# Patient Record
Sex: Male | Born: 1960 | Race: White | Hispanic: No | Marital: Married | Smoking: Former smoker
Health system: Southern US, Community
[De-identification: ages and names within clinical notes are randomized; demographics above are authoritative.]

## PROBLEM LIST (undated history)

## (undated) DIAGNOSIS — E119 Type 2 diabetes mellitus without complications: Secondary | ICD-10-CM

## (undated) HISTORY — DX: Type 2 diabetes mellitus without complications: E11.9

## (undated) HISTORY — PX: TONSILLECTOMY: SUR1361

## (undated) HISTORY — PX: SPINE SURGERY: SHX786

---

## 2008-03-27 ENCOUNTER — Ambulatory Visit (HOSPITAL_COMMUNITY): Admission: RE | Admit: 2008-03-27 | Discharge: 2008-03-28 | Payer: Self-pay | Admitting: Orthopedic Surgery

## 2010-07-28 ENCOUNTER — Other Ambulatory Visit (INDEPENDENT_AMBULATORY_CARE_PROVIDER_SITE_OTHER): Payer: No Typology Code available for payment source | Admitting: Internal Medicine

## 2010-07-28 ENCOUNTER — Other Ambulatory Visit (INDEPENDENT_AMBULATORY_CARE_PROVIDER_SITE_OTHER): Payer: No Typology Code available for payment source

## 2010-07-28 ENCOUNTER — Encounter: Payer: Self-pay | Admitting: Internal Medicine

## 2010-07-28 ENCOUNTER — Ambulatory Visit (INDEPENDENT_AMBULATORY_CARE_PROVIDER_SITE_OTHER): Payer: No Typology Code available for payment source | Admitting: Internal Medicine

## 2010-07-28 VITALS — BP 130/80 | HR 89 | Temp 98.5°F | Resp 16 | Ht 70.0 in | Wt 213.2 lb

## 2010-07-28 DIAGNOSIS — Z0000 Encounter for general adult medical examination without abnormal findings: Secondary | ICD-10-CM

## 2010-07-28 DIAGNOSIS — Z13220 Encounter for screening for lipoid disorders: Secondary | ICD-10-CM

## 2010-07-28 LAB — CBC WITH DIFFERENTIAL/PLATELET
Basophils Absolute: 0 10*3/uL (ref 0.0–0.1)
Basophils Relative: 0.5 % (ref 0.0–3.0)
Eosinophils Relative: 4.5 % (ref 0.0–5.0)
HCT: 41.8 % (ref 39.0–52.0)
Hemoglobin: 14.7 g/dL (ref 13.0–17.0)
Lymphocytes Relative: 28.8 % (ref 12.0–46.0)
Lymphs Abs: 2.2 10*3/uL (ref 0.7–4.0)
Monocytes Relative: 13.1 % — ABNORMAL HIGH (ref 3.0–12.0)
Neutro Abs: 4.2 10*3/uL (ref 1.4–7.7)
RBC: 4.84 Mil/uL (ref 4.22–5.81)
RDW: 13.3 % (ref 11.5–14.6)

## 2010-07-28 LAB — COMPREHENSIVE METABOLIC PANEL
ALT: 47 U/L (ref 0–53)
AST: 24 U/L (ref 0–37)
Albumin: 3.8 g/dL (ref 3.5–5.2)
Alkaline Phosphatase: 72 U/L (ref 39–117)
BUN: 11 mg/dL (ref 6–23)
CO2: 28 mEq/L (ref 19–32)
Calcium: 9.2 mg/dL (ref 8.4–10.5)
Chloride: 107 mEq/L (ref 96–112)
Creatinine, Ser: 0.9 mg/dL (ref 0.4–1.5)
GFR: 91.05 mL/min (ref >60.00)
Glucose, Bld: 131 mg/dL — ABNORMAL HIGH (ref 70–99)
Potassium: 4.3 mEq/L (ref 3.5–5.1)
Sodium: 141 mEq/L (ref 135–145)
Total Bilirubin: 0.6 mg/dL (ref 0.3–1.2)
Total Protein: 6.6 g/dL (ref 6.0–8.3)

## 2010-07-28 LAB — LIPID PANEL
HDL: 39.2 mg/dL (ref >39.00)
Total CHOL/HDL Ratio: 6
VLDL: 33.4 mg/dL (ref 0.0–40.0)

## 2010-07-28 LAB — PSA: PSA: 1.38 ng/mL (ref 0.10–4.00)

## 2010-07-28 LAB — LDL CHOLESTEROL, DIRECT: Direct LDL: 165.7 mg/dL

## 2010-07-28 NOTE — Assessment & Plan Note (Signed)
Labs ordered, pt ed material given, he was reassured about the benign nature of lipomas

## 2010-07-28 NOTE — Patient Instructions (Signed)
Health Maintenance in Males MAINTAIN REGULAR HEALTH EXAMS  Maintain a healthy diet and normal weight. Increased weight leads to problems with blood pressure and diabetes. Decrease fat in the diet and increase exercise. Obtain a proper diet from your caregiver if necessary.   High blood pressure causes heart and blood vessel problems. Check blood pressures regularly and keep your blood pressure at normal limits. Aerobic exercise helps this. Persistent elevations of blood pressure should be treated with medications if weight loss and exercise are ineffective.   Avoid smoking, drinking in excess (more than 2 drinks per day), or use of street drugs. Do not share needles with anyone. Ask for help if you need assistance or instructions on stopping the use of alcohol, cigarettes, or drugs.   Maintain normal blood lipids and cholesterol. Your caregiver can give you information to lower your risk of heart disease or stroke.   Ask your caregiver if you are in need of early heart disease screening because of a strong family history of heart disease or signs of elevated testosterone (male sex hormone) levels. These can predispose you to early heart disease.   Practice safe sex. Practicing safe sex decreases your risk for a sexually transmitted infection (STI). Some of the STIs are gonorrhea, chlamydia, syphilis, trichimonas, herpes, human papillomavirus (HPV), and human immunodeficiency virus (HIV). Herpes, HIV, and HPV are viral illnesses that have no cure. These can result in disability, cancer, and death.   It is not safe for someone who has AIDS or is HIV positive to have unprotected sex with a partner who is HIV positive. The reason for this is the fact that there are many different strains of HIV. If you have a strain that is readily treated with medications and then suddenly introduce a strain from a partner that has no further treatment options, you may suddenly have a strain of HIV that is untreatable.  Even if you are both positive for HIV, it is still necessary to practice safe sex.   Use sunscreen with a SPF of 15 or greater. Being outside in the sun when your shadow caused by the sun is shorter than you are, means you are being exposed to sun at greater intensity. Lighter skinned people are at a greater risk of skin cancer.   Keep carbon monoxide and smoke detectors in your home and functioning at all times. Change the batteries every 6 months.   Do monthly examinations of your testicles. The best time to do this is after a hot shower or bath when the tissues are loose. Notify your caregivers of any lumps, tenderness, or changes in size or shape.   Notify your caregiver of new moles or changes in moles, especially if there is a change in shape or color. Also notify your caregiver if a mole is larger than the size of a pencil eraser.   Stay current with your tetanus shots and other required immunizations.  The Body Mass Index (BMI) is a way of measuring how much of your body is fat. Having a BMI above 27 increases the risk of heart disease, diabetes, hypertension, stroke, and other problems related to obesity. Document Released: 09/25/2007 Document Re-Released: 09/16/2009 [employer] Patient Information 2011 [employer]. 

## 2010-07-28 NOTE — Progress Notes (Signed)
Subjective:    Patient ID: Jonathon Cline, male    DOB: [birth year], 50 y.o.   MRN: [id number]  HPI New to me for a complete physical. He has a lipoma on his right forehead that his wife has been concerned about but he says that it is not painful and that it is getting smaller.    Review of Systems  Constitutional: Negative for fever, chills, diaphoresis, activity change, appetite change, fatigue and unexpected weight change.  HENT: Negative for facial swelling and neck pain.   Respiratory: Negative for cough, chest tightness, shortness of breath, wheezing and stridor.   Cardiovascular: Negative for chest pain, palpitations and leg swelling.  Gastrointestinal: Negative for nausea, vomiting, abdominal pain, diarrhea, constipation, blood in stool, abdominal distention and anal bleeding.  Genitourinary: Negative for dysuria, urgency, frequency, hematuria, flank pain, decreased urine volume, scrotal swelling, enuresis, difficulty urinating and testicular pain.  Musculoskeletal: Negative for myalgias, back pain, joint swelling, arthralgias and gait problem.  Skin: Negative for color change, pallor and rash.  Neurological: Negative for dizziness, tremors, seizures, syncope, facial asymmetry, speech difficulty, weakness, light-headedness, numbness and headaches.  Hematological: Negative for adenopathy. Does not bruise/bleed easily.  Psychiatric/Behavioral: Negative for hallucinations, behavioral problems, confusion, self-injury, dysphoric mood, decreased concentration and agitation. The patient is not nervous/anxious and is not hyperactive.        Objective:   Physical Exam  Vitals reviewed. Constitutional: He is oriented to person, place, and time. He appears well-developed and well-nourished. No distress.  HENT:  Head: Normocephalic and atraumatic.  Right Ear: External ear normal.  Left Ear: External ear normal.  Nose: Nose normal.  Mouth/Throat: Oropharynx is clear and moist. No  oropharyngeal exudate.  Eyes: Conjunctivae and EOM are normal. Pupils are equal, round, and reactive to light. Left eye exhibits no discharge. No scleral icterus.  Neck: Normal range of motion. Neck supple. No JVD present. No thyromegaly present.  Cardiovascular: Normal rate, regular rhythm, normal heart sounds and intact distal pulses.  Exam reveals no gallop and no friction rub.   No murmur heard. Pulmonary/Chest: Effort normal and breath sounds normal. No respiratory distress. He has no wheezes. He has no rales. He exhibits no tenderness.  Abdominal: Soft. Bowel sounds are normal. He exhibits no distension and no mass. There is no tenderness. There is no rebound and no guarding. Hernia confirmed negative in the right inguinal area and confirmed negative in the left inguinal area.  Genitourinary: Prostate normal, testes normal and penis normal. Rectal exam shows external hemorrhoid. Rectal exam shows no internal hemorrhoid, no fissure, no mass, no tenderness and anal tone normal. Guaiac negative stool. Prostate is not enlarged and not tender. Right testis shows no mass, no swelling and no tenderness. Left testis shows no mass, no swelling and no tenderness. Circumcised. No phimosis, hypospadias or penile tenderness.  Musculoskeletal: Normal range of motion. He exhibits no edema and no tenderness.  Lymphadenopathy:    He has no cervical adenopathy.       Right: No inguinal adenopathy present.       Left: No inguinal adenopathy present.  Neurological: He is alert and oriented to person, place, and time. He has normal reflexes. He displays normal reflexes. No cranial nerve deficit. He exhibits normal muscle tone. Coordination normal.  Skin: Skin is warm and dry. No rash noted. He is not diaphoretic. No erythema. No pallor.       He has a lipoma on his right forehead  Psychiatric: He has a normal mood  and affect. His behavior is normal. Judgment and thought content normal.          Assessment &  Plan:

## 2010-07-29 ENCOUNTER — Encounter: Payer: Self-pay | Admitting: Internal Medicine

## 2010-08-25 NOTE — Op Note (Signed)
NAME:  ZAE, KIRTZ NO.:  [id number]   MEDICAL RECORD NO.:  [id number]          PATIENT TYPE:  OIB   LOCATION:  5014                         FACILITY:  [facility]   PHYSICIAN:  Alvy Beal, MD    DATE OF BIRTH:  11/01/1960   DATE OF PROCEDURE:  03/27/2008  DATE OF DISCHARGE:                               OPERATIVE REPORT   HISTORY:  Jonathon Cline is a very pleasant 50 year old gentleman who is referred  to me by our partner for a prolonged period of severe right arm pain,  weakness, and neck pain.  Clinical and radiographic analysis confirmed a  large posterior lateral to the right disk herniation at C5-6 causing C6  radiculopathy.  His clinical and radiographic analysis confirmed the  diagnosis.  After discussing at length risks, benefits, and alternatives  to both surgical and nonsurgical treatment, the patient elected to  proceed with surgery.  All appropriate risks, benefits, and alternatives  were reviewed with the patient and consent was obtained.   PREOPERATIVE DIAGNOSIS:  C5-6 disk herniation posterolateral to the  right.   POSTOPERATIVE DIAGNOSIS:  C5-6 disk herniation posterolateral to the  right.   PROCEDURE:  Anterior cervical diskectomy and fusion C5-6 utilizing the  Synthes vector anterior cervical plate 16 mm high with 14-mm variable  angle locking screws and an 8-mm precut lordotic fibular bone spacer  packed with Actifuse.   OPERATIVE NOTE:  The patient was brought to the operating room and  placed supine on the operating table.  After successful induction of  general anesthesia and endotracheal intubation, TEDs and SCDs were  applied.  A roll of towels were placed between the shoulder blades and  the shoulders were retracted using tape.  The anterior cervical spine  was then prepped and draped in a standard fashion.  Intraoperative  lateral fluoroscopy indicated adequate neutral alignment.  At this  point, I made a transverse incision just below  the just at the level of  cricoid cartilage.  I then sharply dissected down to and through the  platysma.  Once the platysma was dissected, I was able to palpate and  visualize the medial border of the sternocleidomastoid.  I began  dissecting sharply into the deep cervical fascia.  The omohyoid was  identified, just released from its length and I was able to mobilize  without resecting it.  I then bluntly dissected into the deep cervical  fascia and prevertebral fascia so that I could palpate the carotid  artery and manually mobilized the trachea and esophagus to the right.  I  then placed the thyroid retractor into the wound to hold the trachea and  esophagus off to the right hand side.  At this point, I could visualize  the anterior cervical spine.  I placed an 18-gauge needle that was bent  into the interbody space of C5-6 and took a lateral x-ray to confirm  that I was at the appropriate level.  Once I had confirmed this, I then  began to mobilize the longus coli muscles and the anterior longitudinal  ligament.  Using a bipolar  electrocautery, I mobilized the longus coli  muscles out to the level of the uncovertebral joints bilaterally.  I  then placed a 45-mm shadow line Caspar retractor underneath the  mobilized longus coli muscles bilaterally and then deflated the  endotracheal cuff.  I expanded the retractor to the appropriate width  and then reinflated the endotracheal cuff __________ to get a  lower  pressure.  With the retractors placed, I had excellent visualization of  the anterior cervical spine at C5-6 and protection of the vital soft  tissue structures.   At this point, distraction pins were placed to the bodies of C5 and C6  and the disk space was gently distracted.   A 15-blade scalpel was used to incise the annulus and then using a  combination of pituitary rongeurs, curettes, and Kerrison rongeurs, I  resected all the disk material.  Once I was through the majority  of the  disk in the right posterior lateral corner, there was a large fragment  of disk material that was removed.  I then developed a plane underneath  the posterior longitudinal ligament using a fine curette and then used a  1-mm Kerrison to exploit that and resect the posterior longitudinal  ligament.  I was then able to mobilize and remove even more disk  fragment from behind the posterior longitudinal ligament.  At this  point, once I had all the disk fragment out, I was easily able to take a  micro nerve hook and pass it underneath the vertebral bodies of C5 and  C6 and underneath the uncovertebral joints  so that I would be sure  there was no further fragments of disk material.  At this point with the  diskectomy completed, I rasped the endplates so I had bleeding  subchondral bone, measured the interbody space, and then took a precut  fibular allograft strut __________ and hydrated it.  I then placed  Actifuse in the center hole and malleted to the appropriate depth.  X-  rays demonstrated satisfactory position of the graft and I took a 16-mm  plate and gave it a gentle lordotic curve and then secured it to the  vertebral body of C5 with 14-mm variable angle screws in C6 with 14 mm  variable angle screws.  All screws were locked down.  I confirmed that  the __________ all screws were locked down.  I irrigated with copiously  with normal saline, removed the rest of the retractors, and then checked  to ensure the esophagus did not become entrapped underneath the lateral  plate.  I then returned the trachea and esophagus to midline, copiously  irrigated again with normal saline, and took final intraoperative x-  rays, the AP and lateral films were satisfactory.  I then closed the  platysma with interrupted 2-0 Vicryl sutures, and the skin was closed  with 3-0 Monocryl.  Steri-Strips and a dry dressing and a soft collar  were applied.  The patient was extubated and transferred to PACU  without  incident.   At the end of the case, all needle, instrument, and sponge counts were  correct.      Alvy Beal, MD  Electronically Signed     DDB/MEDQ  D:  03/27/2008  T:  03/27/2008  Job:  [id number]

## 2011-01-15 LAB — CBC
Hemoglobin: 15.3 g/dL (ref 13.0–17.0)
MCHC: 34.3 g/dL (ref 30.0–36.0)
MCV: 85.7 fL (ref 78.0–100.0)
RBC: 5.22 MIL/uL (ref 4.22–5.81)
WBC: 10 10*3/uL (ref 4.0–10.5)

## 2012-04-25 ENCOUNTER — Ambulatory Visit (INDEPENDENT_AMBULATORY_CARE_PROVIDER_SITE_OTHER): Payer: BC Managed Care – PPO | Admitting: Family Medicine

## 2012-04-25 VITALS — BP 135/83 | HR 73 | Temp 98.4°F | Resp 16 | Ht 71.0 in | Wt 210.0 lb

## 2012-04-25 DIAGNOSIS — M545 Low back pain, unspecified: Secondary | ICD-10-CM

## 2012-04-25 MED ORDER — HYDROCODONE-ACETAMINOPHEN 5-500 MG PO TABS: 1.0000 | ORAL_TABLET | ORAL | Status: DC | PRN

## 2012-04-25 MED ORDER — CYCLOBENZAPRINE HCL 5 MG PO TABS: ORAL_TABLET | ORAL | Status: DC

## 2012-04-25 MED ORDER — OXAPROZIN 600 MG PO TABS: ORAL_TABLET | ORAL | Status: DC

## 2012-04-25 NOTE — Patient Instructions (Signed)
Read the back owners manual.  Rest  Ice/Heat  Gradually advance activity

## 2012-04-25 NOTE — Progress Notes (Signed)
Subjective: 52 year old man who has been having back pain for 5 days. No specific injury. It is just gradually progressed. There is no radiation of pain. He has had some problems with pain in the past. He gets out of bed, the pain gets so severe that about knocked to the floor. 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 He is not working hard this time. He does not do regular exercise.  Objective: No CVA tenderness. Spine is nontender. Minimal tenderness of the SI joints. On flexion is pain in the low back. Side to side tilt reveals slightly more pain when he tilts to the left. Straight leg raising test negative. Deep tendon reflexes symmetrical  Assessment: Lumbago/low back pain  Plan: Anti-inflammatory and muscle accident medication, as well as some pills for more severe pain. Back exercises manual. Return if worse.

## 2015-01-15 LAB — HM DIABETES EYE EXAM

## 2015-06-23 ENCOUNTER — Ambulatory Visit (INDEPENDENT_AMBULATORY_CARE_PROVIDER_SITE_OTHER): Payer: Commercial Managed Care - PPO

## 2015-06-23 ENCOUNTER — Ambulatory Visit (INDEPENDENT_AMBULATORY_CARE_PROVIDER_SITE_OTHER): Payer: Commercial Managed Care - PPO | Admitting: Family Medicine

## 2015-06-23 VITALS — BP 120/90 | HR 93 | Temp 97.6°F | Resp 18 | Ht 70.0 in | Wt 191.0 lb

## 2015-06-23 DIAGNOSIS — R1084 Generalized abdominal pain: Secondary | ICD-10-CM

## 2015-06-23 DIAGNOSIS — K5900 Constipation, unspecified: Secondary | ICD-10-CM | POA: Diagnosis not present

## 2015-06-23 DIAGNOSIS — E1165 Type 2 diabetes mellitus with hyperglycemia: Secondary | ICD-10-CM | POA: Diagnosis not present

## 2015-06-23 DIAGNOSIS — R81 Glycosuria: Secondary | ICD-10-CM

## 2015-06-23 LAB — POCT CBC
Granulocyte percent: 71.7 %G (ref 37–80)
HCT, POC: 46 % (ref 43.5–53.7)
HEMOGLOBIN: 16.5 g/dL (ref 14.1–18.1)
LYMPH, POC: 1.9 (ref 0.6–3.4)
MCH, POC: 30.6 pg (ref 27–31.2)
MCHC: 35.9 g/dL — AB (ref 31.8–35.4)
MCV: 85 fL (ref 80–97)
MID (CBC): 0.6 (ref 0–0.9)
MPV: 7 fL (ref 0–99.8)
PLATELET COUNT, POC: 375 10*3/uL (ref 142–424)
POC Granulocyte: 6.5 (ref 2–6.9)
POC LYMPH PERCENT: 21.2 %L (ref 10–50)
POC MID %: 7.1 % (ref 0–12)
RBC: 5.41 M/uL (ref 4.69–6.13)
RDW, POC: 12 %
WBC: 9 10*3/uL (ref 4.6–10.2)

## 2015-06-23 LAB — HEMOCCULT GUIAC POC 1CARD (OFFICE): Fecal Occult Blood, POC: NEGATIVE

## 2015-06-23 LAB — POCT URINALYSIS DIP (MANUAL ENTRY)
Bilirubin, UA: NEGATIVE
Glucose, UA: >=1000 — AB
LEUKOCYTES UA: NEGATIVE
NITRITE UA: NEGATIVE
PH UA: 5.5
Spec Grav, UA: 1.015
Urobilinogen, UA: 0.2

## 2015-06-23 LAB — POCT GLYCOSYLATED HEMOGLOBIN (HGB A1C): Hemoglobin A1C: 10.2

## 2015-06-23 LAB — GLUCOSE, POCT (MANUAL RESULT ENTRY): POC GLUCOSE: 318 mg/dL — AB (ref 70–99)

## 2015-06-23 MED ORDER — METFORMIN HCL 500 MG PO TABS: 500.0000 mg | ORAL_TABLET | Freq: Two times a day (BID) | ORAL | Status: DC

## 2015-06-23 MED ORDER — BLOOD GLUCOSE MONITOR KIT: PACK | Status: AC

## 2015-06-23 NOTE — Patient Instructions (Addendum)
IF you received an x-ray today, you will receive an invoice from [facility]. Please contact [facility] at [phone] with questions or concerns regarding your invoice.   IF you received labwork today, you will receive an invoice from [employer]. Please contact [facility] at [phone] with questions or concerns regarding your invoice.   Our billing staff will not be able to assist you with questions regarding bills from these companies.  You will be contacted with the lab results as soon as they are available. The fastest way to get your results is to activate your My Chart account. Instructions are located on the last page of this paperwork. If you have not heard from [place] regarding the results in 2 weeks, please contact this office. Diabetes and Standards of Medical Care Diabetes is complicated. You may find that your diabetes team includes a dietitian, nurse, diabetes educator, eye doctor, and more. To help everyone know what is going on and to help you get the care you deserve, the following schedule of care was developed to help keep you on track. Below are the tests, exams, vaccines, medicines, education, and plans you will need. HbA1c test This test shows how well you have controlled your glucose over the past 2-3 months. It is used to see if your diabetes management plan needs to be adjusted.   It is performed at least 2 times a year if you are meeting treatment goals.  It is performed 4 times a year if therapy has changed or if you are not meeting treatment goals. Blood pressure test  This test is performed at every routine medical visit. The goal is less than 140/90 mm Hg for most people, but 130/80 mm Hg in some cases. Ask your health care provider about your goal. Dental exam  Follow up with the dentist regularly. Eye exam  If you are diagnosed with type 1 diabetes as a child, get an exam upon reaching the age of 68 years or older and  having had diabetes for 3-5 years. Yearly eye exams are recommended after that initial eye exam.  If you are diagnosed with type 1 diabetes as an adult, get an exam within 5 years of diagnosis and then yearly.  If you are diagnosed with type 2 diabetes, get an exam as soon as possible after the diagnosis and then yearly. Foot care exam  Visual foot exams are performed at every routine medical visit. The exams check for cuts, injuries, or other problems with the feet.  You should have a complete foot exam performed every year. This exam includes an inspection of the structure and skin of your feet, a check of the pulses in your feet, and a check of the sensation in your feet.  Type 1 diabetes: The first exam is performed 5 years after diagnosis.  Type 2 diabetes: The first exam is performed at the time of diagnosis.  Check your feet nightly for cuts, injuries, or other problems with your feet. Tell your health care provider if anything is not healing. Kidney function test (urine microalbumin)  This test is performed once a year.  Type 1 diabetes: The first test is performed 5 years after diagnosis.  Type 2 diabetes: The first test is performed at the time of diagnosis.  A serum creatinine and estimated glomerular filtration rate (eGFR) test is done once a year to assess the level of chronic kidney disease (CKD), if present. Lipid profile (cholesterol, HDL, LDL, triglycerides)  Performed every 5  years for most people.  The goal for LDL is less than 100 mg/dL. If you are at high risk, the goal is less than 70 mg/dL.  The goal for HDL is 40 mg/dL-50 mg/dL for men and 50 mg/dL-60 mg/dL for women. An HDL cholesterol of 60 mg/dL or higher gives some protection against heart disease.  The goal for triglycerides is less than 150 mg/dL. Immunizations  The flu (influenza) vaccine is recommended yearly for every person 78 months of age or older who has diabetes.  The pneumonia (pneumococcal)  vaccine is recommended for every person 46 years of age or older who has diabetes. Adults 45 years of age or older may receive the pneumonia vaccine as a series of two separate shots.  The hepatitis B vaccine is recommended for adults shortly after they have been diagnosed with diabetes.  The Tdap (tetanus, diphtheria, and pertussis) vaccine should be given:  According to normal childhood vaccination schedules, for children.  Every 10 years, for adults who have diabetes. Diabetes self-management education  Education is recommended at diagnosis and ongoing as needed. Treatment plan  Your treatment plan is reviewed at every medical visit.   This information is not intended to replace advice given to you by your health care provider. Make sure you discuss any questions you have with your health care provider.   Document Released: 01/24/2009 Document Revised: 04/19/2014 Document Reviewed: 08/29/2012 Elsevier Interactive Patient Education 2016 [employer].   Diabetes Mellitus and Food It is important for you to manage your blood sugar (glucose) level. Your blood glucose level can be greatly affected by what you eat. Eating healthier foods in the appropriate amounts throughout the day at about the same time each day will help you control your blood glucose level. It can also help slow or prevent worsening of your diabetes mellitus. Healthy eating may even help you improve the level of your blood pressure and reach or maintain a healthy weight.  General recommendations for healthful eating and cooking habits include:  Eating meals and snacks regularly. Avoid going long periods of time without eating to lose weight.  Eating a diet that consists mainly of plant-based foods, such as fruits, vegetables, nuts, legumes, and whole grains.  Using low-heat cooking methods, such as baking, instead of high-heat cooking methods, such as deep frying. Work with your dietitian to make sure you understand  how to use the Nutrition Facts information on food labels. HOW CAN FOOD AFFECT ME? Carbohydrates Carbohydrates affect your blood glucose level more than any other type of food. Your dietitian will help you determine how many carbohydrates to eat at each meal and teach you how to count carbohydrates. Counting carbohydrates is important to keep your blood glucose at a healthy level, especially if you are using insulin or taking certain medicines for diabetes mellitus. Alcohol Alcohol can cause sudden decreases in blood glucose (hypoglycemia), especially if you use insulin or take certain medicines for diabetes mellitus. Hypoglycemia can be a life-threatening condition. Symptoms of hypoglycemia (sleepiness, dizziness, and disorientation) are similar to symptoms of having too much alcohol.  If your health care provider has given you approval to drink alcohol, do so in moderation and use the following guidelines:  Women should not have more than one drink per day, and men should not have more than two drinks per day. One drink is equal to:  12 oz of beer.  5 oz of wine.  1 oz of hard liquor.  Do not drink on an empty  stomach.  Keep yourself hydrated. Have water, diet soda, or unsweetened iced tea.  Regular soda, juice, and other mixers might contain a lot of carbohydrates and should be counted. WHAT FOODS ARE NOT RECOMMENDED? As you make food choices, it is important to remember that all foods are not the same. Some foods have fewer nutrients per serving than other foods, even though they might have the same number of calories or carbohydrates. It is difficult to get your body what it needs when you eat foods with fewer nutrients. Examples of foods that you should avoid that are high in calories and carbohydrates but low in nutrients include:  Trans fats (most processed foods list trans fats on the Nutrition Facts label).  Regular soda.  Juice.  Candy.  Sweets, such as cake, pie,  doughnuts, and cookies.  Fried foods. WHAT FOODS CAN I EAT? Eat nutrient-rich foods, which will nourish your body and keep you healthy. The food you should eat also will depend on several factors, including:  The calories you need.  The medicines you take.  Your weight.  Your blood glucose level.  Your blood pressure level.  Your cholesterol level. You should eat a variety of foods, including:  Protein.  Lean cuts of meat.  Proteins low in saturated fats, such as fish, egg whites, and beans. Avoid processed meats.  Fruits and vegetables.  Fruits and vegetables that may help control blood glucose levels, such as apples, mangoes, and yams.  Dairy products.  Choose fat-free or low-fat dairy products, such as milk, yogurt, and cheese.  Grains, bread, pasta, and rice.  Choose whole grain products, such as multigrain bread, whole oats, and brown rice. These foods may help control blood pressure.  Fats.  Foods containing healthful fats, such as nuts, avocado, olive oil, canola oil, and fish. DOES EVERYONE WITH DIABETES MELLITUS HAVE THE SAME MEAL PLAN? Because every person with diabetes mellitus is different, there is not one meal plan that works for everyone. It is very important that you meet with a dietitian who will help you create a meal plan that is just right for you.   This information is not intended to replace advice given to you by your health care provider. Make sure you discuss any questions you have with your health care provider.   Document Released: 12/24/2004 Document Revised: 04/19/2014 Document Reviewed: 02/23/2013 Elsevier Interactive Patient Education [employer].

## 2015-06-23 NOTE — Progress Notes (Signed)
Subjective:  By signing my name below, I, [name], attest that this documentation has been prepared under the direction and in the presence of Delman Cheadle, MD.  Electronically Signed: Thea Alken, ED Scribe. 06/23/2015. 5:33 PM.   Patient ID: Jonathon Cline, male    DOB: Oct 10, 1960, 55 y.o.   MRN: [id number]  HPI Chief Complaint  Patient presents with  . Possible Hernia    Pain for three weeks    HPI Comments: Jonathon Cline is a 55 y.o. male who presents to the [facility] complaining of a abdominal pain that began a couple weeks ago. He reports associated bulge right abdomen, testicular pain and rectal pain with BM. He denies nausea, emesis, constipation, color change in testicle and urinary symptoms.   Patient Active Problem List   Diagnosis Date Noted  . Routine general medical examination at a health care facility 07/28/2010   No past medical history on file. Past Surgical History  Procedure Laterality Date  . Spine surgery      cervical fusion   No Known Allergies Prior to Admission medications   Medication Sig Start Date End Date Taking? Authorizing Provider  cyclobenzaprine (FLEXERIL) 5 MG tablet One or two tablets 3 times daily for muscle relaxant Patient not taking: Reported on 06/23/2015 04/25/12   Posey Boyer, MD  HYDROcodone-acetaminophen (VICODIN) 5-500 MG per tablet Take 1 tablet by mouth every 4 (four) hours as needed for pain. Patient not taking: Reported on 06/23/2015 04/25/12   Posey Boyer, MD  oxaprozin (DAYPRO) 600 MG tablet Take one twice daily for pain and inflammation of back Patient not taking: Reported on 06/23/2015 04/25/12   Posey Boyer, MD   Social History   Social History  . Marital Status: Married    Spouse Name: N/A  . Number of Children: N/A  . Years of Education: N/A   Occupational History  . Not on file.   Social History Main Topics  . Smoking status: Former Research scientist (life sciences)  . Smokeless tobacco: Never Used  . Alcohol  Use: No  . Drug Use: No  . Sexual Activity: Not on file   Other Topics Concern  . Not on file   Social History Narrative   Married    Highest level of education- College   Caffienated beverages-Yes   Seats belts often-Yes   Regular exercise-Yes   Firearms in the home-Yes   Physical abuse-NO   Smoke alarm in home-Yes            Review of Systems  Constitutional: Negative for fever and chills.  Gastrointestinal: Positive for abdominal pain and rectal pain. Negative for nausea, vomiting, diarrhea and constipation.  Genitourinary: Positive for testicular pain. Negative for dysuria, urgency, frequency, decreased urine volume and difficulty urinating.  Skin: Negative for color change.    Objective:   Physical Exam  Constitutional: He is oriented to person, place, and time. He appears well-developed and well-nourished. No distress.  HENT:  Head: Normocephalic and atraumatic.  Eyes: Conjunctivae and EOM are normal.  Neck: Neck supple.  Cardiovascular: Normal rate, regular rhythm and normal heart sounds.   No murmur heard. Pulmonary/Chest: Effort normal and breath sounds normal. No respiratory distress. He has no wheezes. He has no rales. He exhibits no tenderness.  Abdominal: Bowel sounds are decreased. There is generalized tenderness. No hernia. Hernia confirmed negative in the right inguinal area and confirmed negative in the left inguinal area.  Genitourinary: Prostate normal. Rectal exam  shows external hemorrhoid ( 2+) and internal hemorrhoid ( large at 6 o'clock). Rectal exam shows anal tone normal. Prostate is not enlarged.  Diastasis recti. No umbilical hernia. No inguinal hernia. No stool in vault.   Musculoskeletal: Normal range of motion.  Neurological: He is alert and oriented to person, place, and time.  Skin: Skin is warm and dry.  Psychiatric: He has a normal mood and affect. His behavior is normal.  Nursing note and vitals reviewed.  Filed Vitals:   06/23/15 1649    BP: 120/90  Pulse: 93  Temp: 97.6 F (36.4 C)  TempSrc: Oral  Resp: 18  Height: '5\' 10"'$  (1.778 m)  Weight: 191 lb (86.637 kg)  SpO2: 98%   Results for orders placed or performed in visit on 06/23/15  POCT occult blood stool  Result Value Ref Range   Fecal Occult Blood, POC Negative Negative   Card #1 Date 06/23/15    Card #2 Fecal Occult Blod, POC     Card #2 Date     Card #3 Fecal Occult Blood, POC     Card #3 Date    POCT urinalysis dipstick  Result Value Ref Range   Color, UA yellow yellow   Clarity, UA clear clear   Glucose, UA >=1,000 (A) negative   Bilirubin, UA negative negative   Ketones, POC UA trace (5) (A) negative   Spec Grav, UA 1.015    Blood, UA small (A) negative   pH, UA 5.5    Protein Ur, POC =30 (A) negative   Urobilinogen, UA 0.2    Nitrite, UA Negative Negative   Leukocytes, UA Negative Negative  POCT glycosylated hemoglobin (Hb A1C)  Result Value Ref Range   Hemoglobin A1C 10.2   POCT glucose (manual entry)  Result Value Ref Range   POC Glucose 318 (A) 70 - 99 mg/dl  POCT CBC  Result Value Ref Range   WBC 9.0 4.6 - 10.2 K/uL   Lymph, poc 1.9 0.6 - 3.4   POC LYMPH PERCENT 21.2 10 - 50 %L   MID (cbc) 0.6 0 - 0.9   POC MID % 7.1 0 - 12 %M   POC Granulocyte 6.5 2 - 6.9   Granulocyte percent 71.7 37 - 80 %G   RBC 5.41 4.69 - 6.13 M/uL   Hemoglobin 16.5 14.1 - 18.1 g/dL   HCT, POC 46.0 43.5 - 53.7 %   MCV 85.0 80 - 97 fL   MCH, POC 30.6 27 - 31.2 pg   MCHC 35.9 (A) 31.8 - 35.4 g/dL   RDW, POC 12.0 %   Platelet Count, POC 375 142 - 424 K/uL   MPV 7.0 0 - 99.8 fL   Dg Abd 2 Views  06/23/2015  CLINICAL DATA:  Abdominal pain, lump in RIGHT abdomen, testicular pain with bowel movement, question hernia, worsened diffuse abdominal pain since heavy lifting 3 weeks ago EXAM: ABDOMEN - 2 VIEW COMPARISON:  None FINDINGS: Normal bowel gas pattern. No bowel dilatation or bowel wall thickening. Osseous structures unremarkable. No urinary tract  calcification. No extension of bowel gas into the RIGHT inguinal region is evident. IMPRESSION: Normal exam. Electronically Signed   By: Lavonia Dana M.D.   On: 06/23/2015 18:08    Assessment & Plan:   1. Generalized abdominal pain   2. Glucosuria   3. Constipation, unspecified constipation type   4. Type 2 diabetes mellitus with hyperglycemia, without long-term current use of insulin ([place]) - new diagnosis today -  pt and wife shocked.  Reviewed how to check cbgs and interpret #s, diet, and natural history/progression. Cons checking c-peptide level since pt is not the typical type 2 picture.  He will make a f/u appt with his PCP asap - has not been seen in sev yrs.  Start metformin - I suspect his abd pain maybe due to constipation or poss may even has some gastroparesis contributing?  Reassess after starting metformin which should help trx the constipation.    Orders Placed This Encounter  Procedures  . DG Abd 2 Views    Standing Status: Future     Number of Occurrences: 1     Standing Expiration Date: 06/22/2016    Order Specific Question:  Reason for Exam (SYMPTOM  OR DIAGNOSIS REQUIRED)    Answer:  diffuse abdominal pain worsening since heavy lifting 3 wks prior    Order Specific Question:  Preferred imaging location?    Answer:  External  . Comprehensive metabolic panel  . Ambulatory referral to diabetic education    Referral Priority:  Routine    Referral Type:  Consultation    Referral Reason:  Specialty Services Required    Number of Visits Requested:  1  . POCT occult blood stool  . POCT urinalysis dipstick  . POCT glycosylated hemoglobin (Hb A1C)  . POCT glucose (manual entry)  . POCT CBC    Meds ordered this encounter  Medications  . metFORMIN (GLUCOPHAGE) 500 MG tablet    Sig: Take 1 tablet (500 mg total) by mouth 2 (two) times daily with a meal.    Dispense:  60 tablet    Refill:  0  . blood glucose meter kit and supplies KIT    Sig: Dispense based on patient and  insurance preference. Use up to four times daily as directed. E11.65    Dispense:  1 each    Refill:  0    Order Specific Question:  Number of strips    Answer:  1000    Order Specific Question:  Number of lancets    Answer:  1000   Over 40 min spent in face-to-face evaluation of and consultation with patient and coordination of care.  Over 50% of this time was spent counseling this patient.   I personally performed the services described in this documentation, which was scribed in my presence. The recorded information has been reviewed and considered, and addended by me as needed.  Delman Cheadle, MD MPH

## 2015-06-24 LAB — COMPREHENSIVE METABOLIC PANEL
ALT: 68 U/L — ABNORMAL HIGH (ref 9–46)
AST: 45 U/L — AB (ref 10–35)
Albumin: 4.4 g/dL (ref 3.6–5.1)
Alkaline Phosphatase: 129 U/L — ABNORMAL HIGH (ref 40–115)
BILIRUBIN TOTAL: 0.8 mg/dL (ref 0.2–1.2)
BUN: 15 mg/dL (ref 7–25)
CALCIUM: 9.8 mg/dL (ref 8.6–10.3)
CO2: 32 mmol/L — AB (ref 20–31)
CREATININE: 0.84 mg/dL (ref 0.70–1.33)
Chloride: 97 mmol/L — ABNORMAL LOW (ref 98–110)
GLUCOSE: 326 mg/dL — AB (ref 65–99)
Potassium: 4.1 mmol/L (ref 3.5–5.3)
SODIUM: 137 mmol/L (ref 135–146)
Total Protein: 7.5 g/dL (ref 6.1–8.1)

## 2015-06-30 ENCOUNTER — Telehealth: Payer: Self-pay | Admitting: Internal Medicine

## 2015-06-30 NOTE — Telephone Encounter (Signed)
yes

## 2015-06-30 NOTE — Telephone Encounter (Signed)
Scheduled appt.

## 2015-06-30 NOTE — Telephone Encounter (Signed)
Is requesting to re establish with Dr. Ronnald Ramp.  Please advise.

## 2015-07-01 ENCOUNTER — Encounter: Payer: Self-pay | Admitting: Family Medicine

## 2015-07-04 ENCOUNTER — Other Ambulatory Visit (INDEPENDENT_AMBULATORY_CARE_PROVIDER_SITE_OTHER): Payer: Commercial Managed Care - PPO

## 2015-07-04 ENCOUNTER — Ambulatory Visit (HOSPITAL_COMMUNITY)
Admission: RE | Admit: 2015-07-04 | Discharge: 2015-07-04 | Disposition: A | Discharge status: Home / Self Care | Payer: Commercial Managed Care - PPO | Source: Ambulatory Visit | Attending: Gastroenterology | Admitting: Gastroenterology

## 2015-07-04 ENCOUNTER — Ambulatory Visit (INDEPENDENT_AMBULATORY_CARE_PROVIDER_SITE_OTHER): Payer: Commercial Managed Care - PPO | Admitting: Gastroenterology

## 2015-07-04 ENCOUNTER — Encounter: Payer: Self-pay | Admitting: Gastroenterology

## 2015-07-04 VITALS — BP 120/80 | HR 95 | Ht 70.0 in | Wt 187.0 lb

## 2015-07-04 DIAGNOSIS — R945 Abnormal results of liver function studies: Secondary | ICD-10-CM

## 2015-07-04 DIAGNOSIS — R109 Unspecified abdominal pain: Secondary | ICD-10-CM | POA: Insufficient documentation

## 2015-07-04 DIAGNOSIS — R7989 Other specified abnormal findings of blood chemistry: Secondary | ICD-10-CM

## 2015-07-04 DIAGNOSIS — R634 Abnormal weight loss: Secondary | ICD-10-CM | POA: Insufficient documentation

## 2015-07-04 LAB — COMPREHENSIVE METABOLIC PANEL
ALT: 36 U/L (ref 0–53)
AST: 15 U/L (ref 0–37)
Albumin: 4.2 g/dL (ref 3.5–5.2)
Alkaline Phosphatase: 97 U/L (ref 39–117)
BUN: 11 mg/dL (ref 6–23)
CO2: 32 meq/L (ref 19–32)
Calcium: 10 mg/dL (ref 8.4–10.5)
Chloride: 98 mEq/L (ref 96–112)
Creatinine, Ser: 1.02 mg/dL (ref 0.40–1.50)
GFR: 80.32 mL/min (ref >60.00)
GLUCOSE: 169 mg/dL — AB (ref 70–99)
POTASSIUM: 4.4 meq/L (ref 3.5–5.1)
SODIUM: 138 meq/L (ref 135–145)
Total Bilirubin: 0.8 mg/dL (ref 0.2–1.2)
Total Protein: 7.5 g/dL (ref 6.0–8.3)

## 2015-07-04 LAB — CBC WITH DIFFERENTIAL/PLATELET
BASOS ABS: 0.1 10*3/uL (ref 0.0–0.1)
Basophils Relative: 0.7 % (ref 0.0–3.0)
Eosinophils Absolute: 0.3 10*3/uL (ref 0.0–0.7)
Eosinophils Relative: 2.6 % (ref 0.0–5.0)
HCT: 45 % (ref 39.0–52.0)
Hemoglobin: 15.4 g/dL (ref 13.0–17.0)
LYMPHS ABS: 2.3 10*3/uL (ref 0.7–4.0)
Lymphocytes Relative: 23.2 % (ref 12.0–46.0)
MCHC: 34.4 g/dL (ref 30.0–36.0)
MCV: 84.8 fl (ref 78.0–100.0)
MONO ABS: 1.4 10*3/uL — AB (ref 0.1–1.0)
MONOS PCT: 14.3 % — AB (ref 3.0–12.0)
NEUTROS ABS: 5.9 10*3/uL (ref 1.4–7.7)
NEUTROS PCT: 59.2 % (ref 43.0–77.0)
PLATELETS: 389 10*3/uL (ref 150.0–400.0)
RBC: 5.3 Mil/uL (ref 4.22–5.81)
RDW: 12.3 % (ref 11.5–15.5)
WBC: 10 10*3/uL (ref 4.0–10.5)

## 2015-07-04 LAB — PROTIME-INR
INR: 1.2 ratio — ABNORMAL HIGH (ref 0.8–1.0)
Prothrombin Time: 12.8 s (ref 9.6–13.1)

## 2015-07-04 MED ORDER — IOHEXOL 300 MG/ML  SOLN
50.0000 mL | Freq: Once | INTRAMUSCULAR | Status: AC | PRN
  Administered 2015-07-04: 50 mL via ORAL

## 2015-07-04 MED ORDER — HYDROCODONE-ACETAMINOPHEN 5-325 MG PO TABS: 1.0000 | ORAL_TABLET | Freq: Four times a day (QID) | ORAL | Status: DC | PRN

## 2015-07-04 MED ORDER — IOPAMIDOL (ISOVUE-300) INJECTION 61%
100.0000 mL | Freq: Once | INTRAVENOUS | Status: AC | PRN
  Administered 2015-07-04: 100 mL via INTRAVENOUS

## 2015-07-04 NOTE — Progress Notes (Signed)
HPI: This is a very pleasant 55 year old man     who was referred to me by Jonathon Lima, MD  to evaluate  abdominal pollen, change in bowel habits, elevated liver tests .    Chief complaint is abdominal pains, change in bowel habits, elevated liver tests  Change in his bowels, has been quite constipated (mag citrate 2 bottles, dulcolax).  Lost control of a hand truck, injured his abd wall.  Took some vicodin after that.  Sees Dr. Ronnald Cline as new patient, to address the DM.  Has been losing weight recently in psat 1-2 months.  He's lost 15-20 pounds in the past couple months  Has abd pains after eating, soon afterwards.   Pain is pan abdominal, feels like he is blocked, the pain seems really fairly constant but it is worse after he eats. He is having trouble sleeping because the pain  Used to have BM daily. Lately No BM without a laxative.  No overt blood in his stool.  Etoh weekly, not really an issue for him.  His father had lung cancer   Plain abd film 06/2015: normal Labs 06/2015: cbc normal, Hb A1c 10.2, liver tests (alk phos, transaminases slightly elevated)   Review of systems: Pertinent positive and negative review of systems were noted in the above HPI section. Complete review of systems was performed and was otherwise normal.   Past Medical History  Diagnosis Date  . Diabetes [facility])     Past Surgical History  Procedure Laterality Date  . Spine surgery      cervical fusion    Current Outpatient Prescriptions  Medication Sig Dispense Refill  . bisacodyl (DULCOLAX) 5 MG EC tablet Take 5 mg by mouth daily as needed for moderate constipation.    . blood glucose meter kit and supplies KIT Dispense based on patient and insurance preference. Use up to four times daily as directed. E11.65 1 each 0  . Docusate Sodium (DULCOLAX STOOL SOFTENER PO) Take by mouth as needed.    . metFORMIN (GLUCOPHAGE) 500 MG tablet Take 1 tablet (500 mg total) by mouth 2 (two) times daily  with a meal. 60 tablet 0   No current facility-administered medications for this visit.    Allergies as of 07/04/2015  . (No Known Allergies)    Family History  Problem Relation Age of Onset  . Cancer Other     Lung  . Lung cancer Mother   . Lung cancer Father     Social History   Social History  . Marital Status: Married    Spouse Name: N/A  . Number of Children: N/A  . Years of Education: N/A   Occupational History  . Not on file.   Social History Main Topics  . Smoking status: Former Research scientist (life sciences)  . Smokeless tobacco: Never Used  . Alcohol Use: No  . Drug Use: No  . Sexual Activity: Not on file   Other Topics Concern  . Not on file   Social History Narrative   Married    Highest level of education- College   Caffienated beverages-Yes   Seats belts often-Yes   Regular exercise-Yes   Firearms in the home-Yes   Physical abuse-NO   Smoke alarm in home-Yes              Physical Exam: Ht '5\' 10"'  (1.778 m)  Wt 187 lb (84.823 kg)  BMI 26.83 kg/m2 Constitutional: generally well-appearing Psychiatric: alert and oriented x3 Eyes: extraocular movements intact Mouth: oral  pharynx moist, no lesions Neck: supple no lymphadenopathy Cardiovascular: heart regular rate and rhythm Lungs: clear to auscultation bilaterally Abdomen: soft, Mild abdominal tenderness throughout nondistended, no obvious ascites, no peritoneal signs, normal bowel sounds Extremities: no lower extremity edema bilaterally Skin: no lesions on visible extremities   Assessment and plan: 56 y.o. male with  abdominal pain, elevated liver tests, newly diabetic, unintentional weight loss  Fairly concerned about the possibility of underlying malignancy here. Metastatic pancreas cancer, metastatic colon cancer? Start this workup with repeat set of labs including a CBC, complete metabolic profile, coags. We are going to arrange for an abdominal CT scan with IV and oral contrast hopefully to be done  today.   Jonathon Loffler, MD [facility] 07/04/2015, 11:13 AM  Cc: Jonathon Lima, MD

## 2015-07-04 NOTE — Patient Instructions (Addendum)
You will be set up for a CT scan of abdomen and pelvis with IV and oral contrast (for abd pains, elevated liver tests). You will have labs checked today in the basement lab.  Please head down after you check out with the front desk  (cmet, cbc, inr). Trial of miralax  (one dose once daily) for now.  You have been scheduled for a CT scan of the abdomen and pelvis at [place] over to [facility] now for your CT scan  ________________________________________________________________________

## 2015-07-07 ENCOUNTER — Telehealth: Payer: Self-pay | Admitting: *Deleted

## 2015-07-07 ENCOUNTER — Telehealth: Payer: Self-pay | Admitting: Gastroenterology

## 2015-07-07 ENCOUNTER — Other Ambulatory Visit (INDEPENDENT_AMBULATORY_CARE_PROVIDER_SITE_OTHER): Payer: Commercial Managed Care - PPO

## 2015-07-07 ENCOUNTER — Other Ambulatory Visit: Payer: Self-pay

## 2015-07-07 DIAGNOSIS — C762 Malignant neoplasm of abdomen: Secondary | ICD-10-CM | POA: Diagnosis not present

## 2015-07-07 LAB — PSA: PSA: 0.87 ng/mL (ref 0.10–4.00)

## 2015-07-07 MED ORDER — NA SULFATE-K SULFATE-MG SULF 17.5-3.13-1.6 GM/177ML PO SOLN: 1.0000 | Freq: Once | ORAL | Status: DC

## 2015-07-07 NOTE — Telephone Encounter (Signed)
Oncology Nurse Navigator Documentation  Oncology Nurse Navigator Flowsheets 07/07/2015  Navigator Location [facility]  Navigator Encounter Type Introductory phone call  Spoke with patient and provided new patient appointment for 07/10/15 at 2:30 pm with Dr. Truitt Merle. Informed of location of [place], valet service, and registration process. Reminded to bring insurance cards and a current medication list, including supplements. Patient verbalizes understanding. HIM notified via inbasket.

## 2015-07-07 NOTE — Telephone Encounter (Signed)
The pt did confirm that he does not need to drink the contrast for the Chest CT

## 2015-07-08 LAB — CEA: CEA: 147.3 ng/mL — ABNORMAL HIGH

## 2015-07-08 LAB — CANCER ANTIGEN 19-9: CA 19 9: 271 U/mL — AB (ref <34)

## 2015-07-09 ENCOUNTER — Encounter: Payer: Self-pay | Admitting: Gastroenterology

## 2015-07-09 ENCOUNTER — Ambulatory Visit (AMBULATORY_SURGERY_CENTER): Payer: Commercial Managed Care - PPO | Admitting: Gastroenterology

## 2015-07-09 ENCOUNTER — Telehealth: Payer: Self-pay

## 2015-07-09 VITALS — BP 132/89 | HR 95 | Temp 98.9°F | Resp 11 | Ht 70.0 in | Wt 187.0 lb

## 2015-07-09 DIAGNOSIS — D123 Benign neoplasm of transverse colon: Secondary | ICD-10-CM

## 2015-07-09 DIAGNOSIS — K209 Esophagitis, unspecified: Secondary | ICD-10-CM | POA: Diagnosis not present

## 2015-07-09 DIAGNOSIS — C7951 Secondary malignant neoplasm of bone: Secondary | ICD-10-CM

## 2015-07-09 DIAGNOSIS — R1084 Generalized abdominal pain: Secondary | ICD-10-CM | POA: Diagnosis not present

## 2015-07-09 DIAGNOSIS — K219 Gastro-esophageal reflux disease without esophagitis: Secondary | ICD-10-CM | POA: Diagnosis not present

## 2015-07-09 LAB — HM COLONOSCOPY

## 2015-07-09 LAB — GLUCOSE, CAPILLARY
GLUCOSE-CAPILLARY: 152 mg/dL — AB (ref 65–99)
Glucose-Capillary: 111 mg/dL — ABNORMAL HIGH (ref 65–99)

## 2015-07-09 MED ORDER — SODIUM CHLORIDE 0.9 % IV SOLN: 500.0000 mL | INTRAVENOUS | Status: DC

## 2015-07-09 NOTE — Telephone Encounter (Signed)
  Oncology Nurse Navigator Documentation  Navigator Location: [facility] (07/09/15 1128) Navigator Encounter Type: Telephone (07/09/15 1128) Telephone: Incoming Call;Appt Confirmation/Clarification (07/09/15 1128)  Wife called to clarify his appointments-reviewed CT appt., Dr. Burr Medico visit and biospy for 07/15/15. She is asking if biopsy could be done sooner? Called interventional radiology and spoke with Tiffany: 07/15/15 is first available they have. Will follow up on referral to Dr. Lisbeth Renshaw.

## 2015-07-09 NOTE — Telephone Encounter (Signed)
The referral has been made.  I will confirm appt with Dr Lisbeth Renshaw

## 2015-07-09 NOTE — Progress Notes (Signed)
A/ox3, pleased with MAC, report to RN 

## 2015-07-09 NOTE — Progress Notes (Signed)
Called to room to assist during endoscopic procedure.  Patient ID and intended procedure confirmed with present staff. Received instructions for my participation in the procedure from the performing physician.  

## 2015-07-09 NOTE — Patient Instructions (Signed)
Colon polyp x 1 removed, and biopsies taken. Result letter in your mail in about 2-3 weeks.  Handout given on polyps.   YOU HAD AN ENDOSCOPIC PROCEDURE TODAY AT [facility]:   Refer to the procedure report that was given to you for any specific questions about what was found during the examination.  If the procedure report does not answer your questions, please call your gastroenterologist to clarify.  If you requested that your care partner not be given the details of your procedure findings, then the procedure report has been included in a sealed envelope for you to review at your convenience later.  YOU SHOULD EXPECT: Some feelings of bloating in the abdomen. Passage of more gas than usual.  Walking can help get rid of the air that was put into your GI tract during the procedure and reduce the bloating. If you had a lower endoscopy (such as a colonoscopy or flexible sigmoidoscopy) you may notice spotting of blood in your stool or on the toilet paper. If you underwent a bowel prep for your procedure, you may not have a normal bowel movement for a few days.  Please Note:  You might notice some irritation and congestion in your nose or some drainage.  This is from the oxygen used during your procedure.  There is no need for concern and it should clear up in a day or so.  SYMPTOMS TO REPORT IMMEDIATELY:   Following lower endoscopy (colonoscopy or flexible sigmoidoscopy):  Excessive amounts of blood in the stool  Significant tenderness or worsening of abdominal pains  Swelling of the abdomen that is new, acute  Fever of 100F or higher   Following upper endoscopy (EGD)  Vomiting of blood or coffee ground material  New chest pain or pain under the shoulder blades  Painful or persistently difficult swallowing  New shortness of breath  Fever of 100F or higher  Black, tarry-looking stools  For urgent or emergent issues, a gastroenterologist can be reached at any hour by calling  [phone].   DIET: Your first meal following the procedure should be a small meal and then it is ok to progress to your normal diet. Heavy or fried foods are harder to digest and may make you feel nauseous or bloated.  Likewise, meals heavy in dairy and vegetables can increase bloating.  Drink plenty of fluids but you should avoid alcoholic beverages for 24 hours.  ACTIVITY:  You should plan to take it easy for the rest of today and you should NOT DRIVE or use heavy machinery until tomorrow (because of the sedation medicines used during the test).    FOLLOW UP: Our staff will call the number listed on your records the next business day following your procedure to check on you and address any questions or concerns that you may have regarding the information given to you following your procedure. If we do not reach you, we will leave a message.  However, if you are feeling well and you are not experiencing any problems, there is no need to return our call.  We will assume that you have returned to your regular daily activities without incident.  If any biopsies were taken you will be contacted by phone or by letter within the next 1-3 weeks.  Please call us at [phone] if you have not heard about the biopsies in 3 weeks.    SIGNATURES/CONFIDENTIALITY: You and/or your care partner have signed paperwork which will be entered into  your electronic medical record.  These signatures attest to the fact that that the information above on your After Visit Summary has been reviewed and is understood.  Full responsibility of the confidentiality of this discharge information lies with you and/or your care-partner.

## 2015-07-09 NOTE — Telephone Encounter (Signed)
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  [place], He has no back pain, no tenderness along spine either but I'm hoping you can see him in clinic.  I think he may need your help in the future sometime. Thanks

## 2015-07-09 NOTE — Op Note (Signed)
[facility] Patient Name: Jonathon Cline Procedure Date: 07/09/2015 8:37 AM MRN: [id number] Endoscopist: Milus Banister , MD Age: 55 Referring MD:  Date of Birth: 1961/02/16 Gender: Male Procedure:                Upper GI endoscopy Indications:              Generalized abdominal pain Medicines:                Monitored Anesthesia Care Procedure:                Pre-Anesthesia Assessment:                           - Prior to the procedure, a History and Physical                            was performed, and patient medications and                            allergies were reviewed. The patient's tolerance of                            previous anesthesia was also reviewed. The risks                            and benefits of the procedure and the sedation                            options and risks were discussed with the patient.                            All questions were answered, and informed consent                            was obtained. Prior Anticoagulants: The patient has                            taken no previous anticoagulant or antiplatelet                            agents. ASA Grade Assessment: II - A patient with                            mild systemic disease. After reviewing the risks                            and benefits, the patient was deemed in                            satisfactory condition to undergo the procedure.                           After obtaining informed consent, the endoscope was  passed under direct vision. Throughout the                            procedure, the patient's blood pressure, pulse, and                            oxygen saturations were monitored continuously. The                            Model GIF-[id number]) scope was introduced                            through the mouth, and advanced to the third part                            of duodenum. The upper GI endoscopy was                  accomplished without difficulty. The patient                            tolerated the procedure well. Scope In: Scope Out: Findings:      LA Grade A (one or more mucosal breaks less than 5 mm, not extending       between tops of 2 mucosal folds) esophagitis with no bleeding was found       at the gastroesophageal junction. Biopsies were taken with a cold       forceps for histology.      A small hiatal hernia was present.      The exam was otherwise without abnormality. Complications:            No immediate complications. Estimated blood loss:                            None. Estimated Blood Loss:     Estimated blood loss: none. Impression:               - LA Grade A esophagitis. Biopsied.                           - Small hiatal hernia.                           - The examination was otherwise normal. Recommendation:           - Patient has a contact number available for                            emergencies. The signs and symptoms of potential                            delayed complications were discussed with the                            patient. Return to normal activities tomorrow.  Written discharge instructions were provided to the                            patient.                           - Resume previous diet.                           - Continue present medications.                           - Await pathology results. Procedure Code(s):        --- Professional ---                           [phone], Esophagogastroduodenoscopy, flexible,                            transoral; with biopsy, single or multiple CPT copyright 2016 American Medical Association. All rights reserved. Milus Banister, MD 07/09/2015 9:12:26 AM This report has been signed electronically. Number of Addenda: 0 Referring MD:      Janith Lima, MD

## 2015-07-09 NOTE — Telephone Encounter (Signed)
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  [place], He has no back pain, no tenderness along spine either but I'm hoping you can see him in clinic.  I think he may need your help in the future sometime. Thanks

## 2015-07-09 NOTE — Op Note (Signed)
[facility] Patient Name: Toran Wimpy Procedure Date: 07/09/2015 8:38 AM MRN: [id number] Endoscopist: Milus Banister , MD Age: 55 Referring MD:  Date of Birth: 28-Dec-1960 Gender: Male Procedure:                Colonoscopy Indications:              Generalized abdominal pain (recent CT showed                            peritoneal carcinomatosis, unknown primary) Medicines:                Monitored Anesthesia Care Procedure:                Pre-Anesthesia Assessment:                           - Prior to the procedure, a History and Physical                            was performed, and patient medications and                            allergies were reviewed. The patient's tolerance of                            previous anesthesia was also reviewed. The risks                            and benefits of the procedure and the sedation                            options and risks were discussed with the patient.                            All questions were answered, and informed consent                            was obtained. Prior Anticoagulants: The patient has                            taken no previous anticoagulant or antiplatelet                            agents. ASA Grade Assessment: II - A patient with                            mild systemic disease. After reviewing the risks                            and benefits, the patient was deemed in                            satisfactory condition to undergo the procedure.  After obtaining informed consent, the colonoscope                            was passed under direct vision. Throughout the                            procedure, the patient's blood pressure, pulse, and                            oxygen saturations were monitored continuously. The                            Model CF-HQ190L [id number]) scope was introduced                            through the anus and advanced to the the  cecum,                            identified by appendiceal orifice and ileocecal                            valve. The colonoscopy was performed without                            difficulty. The patient tolerated the procedure                            well. The quality of the bowel preparation was                            excellent. The ileocecal valve, appendiceal                            orifice, and rectum were photographed. Scope In: 8:48:49 AM Scope Out: 8:57:45 AM Scope Withdrawal Time: 0 hours 5 minutes 46 seconds  Total Procedure Duration: 0 hours 8 minutes 56 seconds  Findings:      A 9 mm polyp was found in the transverse colon. The polyp was sessile.       The polyp was removed with a cold snare. Resection and retrieval were       complete.      The exam was otherwise without abnormality on direct and retroflexion       views. Complications:            No immediate complications. Estimated blood loss:                            None. Estimated Blood Loss:     Estimated blood loss: none. Impression:               - One 9 mm polyp in the transverse colon, removed                            with a cold snare. Resected and retrieved.                           -  The examination was otherwise normal on direct                            and retroflexion views. Recommendation:           - Patient has a contact number available for                            emergencies. The signs and symptoms of potential                            delayed complications were discussed with the                            patient. Return to normal activities tomorrow.                            Written discharge instructions were provided to the                            patient.                           - Resume previous diet.                           - Continue present medications.                           - Repeat colonoscopy is recommended. The                            colonoscopy  date will be determined after pathology                            results from today's exam become available for                            review. Procedure Code(s):        --- Professional ---                           [phone], Colonoscopy, flexible; with removal of                            tumor(s), polyp(s), or other lesion(s) by snare                            technique CPT copyright 2016 [employer]. All rights reserved. Milus Banister, MD 07/09/2015 9:00:12 AM This report has been signed electronically. Number of Addenda: 0 Referring MD:      Janith Lima, MD

## 2015-07-10 ENCOUNTER — Encounter (HOSPITAL_COMMUNITY): Payer: Self-pay

## 2015-07-10 ENCOUNTER — Telehealth: Payer: Self-pay | Admitting: Hematology

## 2015-07-10 ENCOUNTER — Encounter: Payer: Self-pay | Admitting: Hematology

## 2015-07-10 ENCOUNTER — Telehealth: Payer: Self-pay | Admitting: Gastroenterology

## 2015-07-10 ENCOUNTER — Ambulatory Visit (HOSPITAL_COMMUNITY)
Admission: RE | Admit: 2015-07-10 | Discharge: 2015-07-10 | Disposition: A | Discharge status: Home / Self Care | Payer: Commercial Managed Care - PPO | Source: Ambulatory Visit | Attending: Gastroenterology | Admitting: Gastroenterology

## 2015-07-10 ENCOUNTER — Ambulatory Visit (HOSPITAL_BASED_OUTPATIENT_CLINIC_OR_DEPARTMENT_OTHER): Payer: Commercial Managed Care - PPO | Admitting: Hematology

## 2015-07-10 ENCOUNTER — Telehealth: Payer: Self-pay | Admitting: Emergency Medicine

## 2015-07-10 VITALS — BP 125/82 | HR 99 | Temp 98.5°F | Resp 18 | Ht 70.0 in | Wt 187.1 lb

## 2015-07-10 DIAGNOSIS — C762 Malignant neoplasm of abdomen: Secondary | ICD-10-CM | POA: Diagnosis not present

## 2015-07-10 DIAGNOSIS — R188 Other ascites: Secondary | ICD-10-CM | POA: Diagnosis not present

## 2015-07-10 DIAGNOSIS — C786 Secondary malignant neoplasm of retroperitoneum and peritoneum: Secondary | ICD-10-CM

## 2015-07-10 DIAGNOSIS — R1084 Generalized abdominal pain: Secondary | ICD-10-CM

## 2015-07-10 DIAGNOSIS — C7951 Secondary malignant neoplasm of bone: Secondary | ICD-10-CM | POA: Diagnosis not present

## 2015-07-10 DIAGNOSIS — R918 Other nonspecific abnormal finding of lung field: Secondary | ICD-10-CM | POA: Insufficient documentation

## 2015-07-10 DIAGNOSIS — R634 Abnormal weight loss: Secondary | ICD-10-CM

## 2015-07-10 DIAGNOSIS — K8020 Calculus of gallbladder without cholecystitis without obstruction: Secondary | ICD-10-CM | POA: Diagnosis not present

## 2015-07-10 DIAGNOSIS — R1030 Lower abdominal pain, unspecified: Secondary | ICD-10-CM

## 2015-07-10 DIAGNOSIS — C801 Malignant (primary) neoplasm, unspecified: Secondary | ICD-10-CM

## 2015-07-10 DIAGNOSIS — C799 Secondary malignant neoplasm of unspecified site: Secondary | ICD-10-CM

## 2015-07-10 DIAGNOSIS — R630 Anorexia: Secondary | ICD-10-CM

## 2015-07-10 DIAGNOSIS — K760 Fatty (change of) liver, not elsewhere classified: Secondary | ICD-10-CM | POA: Insufficient documentation

## 2015-07-10 MED ORDER — HYDROCODONE-ACETAMINOPHEN 5-325 MG PO TABS: 1.0000 | ORAL_TABLET | Freq: Four times a day (QID) | ORAL | Status: DC | PRN

## 2015-07-10 MED ORDER — IOPAMIDOL (ISOVUE-300) INJECTION 61%
75.0000 mL | Freq: Once | INTRAVENOUS | Status: AC | PRN
  Administered 2015-07-10: 75 mL via INTRAVENOUS

## 2015-07-10 MED ORDER — HYDROCODONE BITARTRATE 10 MG PO C12A: 10.0000 mg | EXTENDED_RELEASE_CAPSULE | Freq: Two times a day (BID) | ORAL | Status: DC

## 2015-07-10 MED ORDER — FENTANYL 25 MCG/HR TD PT72: 25.0000 ug | MEDICATED_PATCH | TRANSDERMAL | Status: DC

## 2015-07-10 NOTE — Telephone Encounter (Signed)
Gave and printed appt sched adn avs for pt for April

## 2015-07-10 NOTE — Telephone Encounter (Signed)
  Follow up Call-  Call back number 07/09/2015  Post procedure Call Back phone  # [phone]  Permission to leave phone message Yes     Patient questions:  Do you have a fever, pain , or abdominal swelling? No. Pain Score  0 *  Have you tolerated food without any problems? Yes.    Have you been able to return to your normal activities? Yes.    Do you have any questions about your discharge instructions: Diet   No. Medications  No. Follow up visit  No.  Do you have questions or concerns about your Care? No.  Actions: * If pain score is 4 or above: No action needed, pain <4.

## 2015-07-10 NOTE — Telephone Encounter (Signed)
I will walk over and get it signed.

## 2015-07-10 NOTE — Progress Notes (Signed)
[facility]  Telephone:([phone]) [phone] Fax:([phone]) [place] Note   Patient Care Team: Janith Lima, MD as PCP - General (Internal Medicine) 07/10/2015   Referring physician: Dr. Ardis Hughs  CHIEF COMPLAINTS/PURPOSE OF CONSULTATION:  Abnormal scan findings, suspicious for metastatic malignancy.  HISTORY OF PRESENTING ILLNESS:  Jonathon Cline 55 y.o. male is here because of his abnormal CT scan findings, which is harvest spacers for metastatic malignancy. He is accompanied by his wife and sister to my clinic today.  He had an accident when he was moving a heavy stuff one month ago and he though he pulled his abdominal muscles. He has been having intermittent lower abdominal pain since then, it locates in his low abd , worse when he laugh, cough or after meals, it wakes him up at night sometimes. He also noticed mild chest pain when he coughs or lumps. No dyspnea or productive cough. He denies significant abdomen blating, no nausea or change of BM (except constipation from pain meds), he has lost aboiot 10-15 lbs in the past one month, appetite lower, stopped working one months due to the pain, he takes 2-6 vicodin a day. He was initially seen by urgent care, lab showed hyperglycemia, abnormal liver function. He was referred to gastroenterologist Dr. Ardis Hughs. CT of abdomen and pelvis with contrast was obtained on 07/04/2015, which showed peritoneal carcinomatosis, lytic bone lesions in T9 and T11, no definitive primary malignancy. He underwent EGD and colonoscopy yesterday by Dr. Ardis Hughs, no primary tumor was found.  He has not see her PCP for 5 years, and has scheduled to see PCP next week.   MEDICAL HISTORY:  Past Medical History  Diagnosis Date  . Diabetes [facility])     SURGICAL HISTORY: Past Surgical History  Procedure Laterality Date  . Spine surgery      cervical fusion  . Tonsillectomy      SOCIAL HISTORY: Social History   Social History  . Marital  Status: Married    Spouse Name: N/A  . Number of Children: N/A  . Years of Education: N/A   Occupational History  . Not on file.   Social History Main Topics  . Smoking status: Former Research scientist (life sciences)  . Smokeless tobacco: Never Used  . Alcohol Use: No  . Drug Use: No  . Sexual Activity: Not on file   Other Topics Concern  . Not on file   Social History Narrative   Married    Highest level of education- College   Caffienated beverages-Yes   Seats belts often-Yes   Regular exercise-Yes   Firearms in the home-Yes   Physical abuse-NO   Smoke alarm in home-Yes             FAMILY HISTORY: Family History  Problem Relation Age of Onset  . Cancer Other     Lung  . Lung cancer Mother   . Lung cancer Father   . Colon cancer Neg Hx   . Esophageal cancer Neg Hx   . Stomach cancer Neg Hx     ALLERGIES:  has No Known Allergies.  MEDICATIONS:  Current Outpatient Prescriptions  Medication Sig Dispense Refill  . bisacodyl (DULCOLAX) 5 MG EC tablet Take 5 mg by mouth daily as needed for moderate constipation. Reported on 07/09/2015    . blood glucose meter kit and supplies KIT Dispense based on patient and insurance preference. Use up to four times daily as directed. E11.65 1 each 0  . Docusate Sodium (DULCOLAX  STOOL SOFTENER PO) Take by mouth as needed.    Marland Kitchen HYDROcodone-acetaminophen (NORCO/VICODIN) 5-325 MG tablet Take 1 tablet by mouth every 6 (six) hours as needed for moderate pain. 50 tablet 0  . metFORMIN (GLUCOPHAGE) 500 MG tablet Take 1 tablet (500 mg total) by mouth 2 (two) times daily with a meal. 60 tablet 0   No current facility-administered medications for this visit.    REVIEW OF SYSTEMS:   Constitutional: Denies fevers, chills or abnormal night sweats Eyes: Denies blurriness of vision, double vision or watery eyes Ears, nose, mouth, throat, and face: Denies mucositis or sore throat Respiratory: Denies cough, dyspnea or wheezes Cardiovascular: Denies palpitation, chest  discomfort or lower extremity swelling Gastrointestinal:  Denies nausea, heartburn or change in bowel habits Skin: Denies abnormal skin rashes Lymphatics: Denies new lymphadenopathy or easy bruising Neurological:Denies numbness, tingling or new weaknesses Behavioral/Psych: Mood is stable, no new changes  All other systems were reviewed with the patient and are negative.  PHYSICAL EXAMINATION: ECOG PERFORMANCE STATUS: 1 - Symptomatic but completely ambulatory  Filed Vitals:   07/10/15 1514  BP: 125/82  Pulse: 99  Temp: 98.5 F (36.9 C)  Resp: 18   Filed Weights   07/10/15 1514  Weight: 187 lb 1.6 oz (84.868 kg)    GENERAL:alert, no distress and comfortable SKIN: skin color, texture, turgor are normal, no rashes or significant lesions EYES: normal, conjunctiva are pink and non-injected, sclera clear OROPHARYNX:no exudate, no erythema and lips, buccal mucosa, and tongue normal  NECK: supple, thyroid normal size, non-tender, without nodularity LYMPH:  no palpable lymphadenopathy in the cervical, axillary or inguinal LUNGS: clear to auscultation and percussion with normal breathing effort HEART: regular rate & rhythm and no murmurs and no lower extremity edema ABDOMEN:abdomen soft, Slightly bloated, diffuse tenderness in his lower abdomen, no organomegaly , no palpable mass, normal bowel sounds Musculoskeletal:no cyanosis of digits and no clubbing  PSYCH: alert & oriented x 3 with fluent speech NEURO: no focal motor/sensory deficits  LABORATORY DATA:  I have reviewed the data as listed CBC Latest Ref Rng 07/04/2015 06/23/2015 07/28/2010  WBC 4.0 - 10.5 K/uL 10.0 9.0 7.8  Hemoglobin 13.0 - 17.0 g/dL 15.4 16.5 14.7  Hematocrit 39.0 - 52.0 % 45.0 46.0 41.8  Platelets 150.0 - 400.0 K/uL 389.0 - 298.0    CMP Latest Ref Rng 07/04/2015 06/23/2015 07/28/2010  Glucose 70 - 99 mg/dL 169(H) 326(H) 131(H)  BUN 6 - 23 mg/dL _0 Creatinine 0.40 - 1.50 mg/dL 1.02 0.84 0.9  Sodium 135 -  145 mEq/L 138 137 141  Potassium 3.5 - 5.1 mEq/L 4.4 4.1 4.3  Chloride 96 - 112 mEq/L 98 97(L) 107  CO2 19 - 32 mEq/L 32 32(H) 28  Calcium 8.4 - 10.5 mg/dL 10.0 9.8 9.2  Total Protein 6.0 - 8.3 g/dL 7.5 7.5 6.6  Total Bilirubin 0.2 - 1.2 mg/dL 0.8 0.8 0.6  Alkaline Phos 39 - 117 U/L 97 129(H) 72  AST 0 - 37 U/L 15 45(H) 24  ALT 0 - 53 U/L 36 68(H) 47   Results for Jonathon, Cline (MRN [id number]) as of 07/10/2015 08:31  Ref. Range 07/07/2015 08:51  CA 19-9 Latest Ref Range: <34 U/mL 271 (H)  CEA Latest Units: ng/mL 147.3 (H)  PSA Latest Ref Range: 0.10-4.00 ng/mL 0.87    RADIOGRAPHIC STUDIES: I have personally reviewed the radiological images as listed and agreed with the findings in the report.  Ct Abdomen Pelvis W Contrast 07/04/2015  IMPRESSION:  1. Findings of peritoneal carcinomatosis, including omental caking, mesenteric nodularity, nodular peritoneal thickening in the bilateral paracolic gutters, thickening and enhancement of the pelvic peritoneum and trace pelvic ascites.  2. Lytic osseous lesions in the T9 and T11 vertebral bodies worrisome for osseous metastases. The T11 osseous lesion appears to minimally efface the thoracic spinal canal anteriorly at this level, without CT evidence of thoracic cord compression. Correlate with thoracic MRI as clinically warranted.  3. A definite primary malignancy is not evident. Nonspecific mild circumferential wall thickening in the lower thoracic esophagus, and questionable wall thickening in the gastric antrum, cannot exclude neoplasm in these locations. Consider further evaluation with upper and lower endoscopy and PET-CT.  4. Two 3 mm pulmonary nodules in the right lower lobe, indeterminate.  5. Additional findings include coronary atherosclerosis, diffuse hepatic steatosis, cholelithiasis and mild prostatomegaly. These results will be called to the ordering clinician or representative by the Radiologist Assistant, and communication  documented in the PACS or zVision Dashboard. Electronically Signed   By: Ilona Sorrel M.D.   On: 07/04/2015 17:11   CT chest w contrast 07/10/2015 IMPRESSION: 9 mm spiculated pulmonary nodule in left upper lobe, with morphology suspicious for a primary bronchogenic carcinoma. 6 mm mediastinal lymph node in the lateral aortic region noted, but not considered pathologically enlarged. Consider PET-CT for further evaluation.  Indeterminate sub-cm right lung nodules, largest measuring 5 mm. Continued attention recommended on follow-up imaging.  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  Findings of upper abdominal peritoneal carcinomatosis again noted.  EGD AND COLONOSCOPY 07/09/2015 Impression: A Grade A esophagitis. Biopsied. - Small hiatal hernia. - The examination was otherwise normal. - One 9 mm polyp in the transverse colon, removed with a cold snare. Resected and retrieved. - The examination was otherwise normal on direct and retroflexion views.   ASSESSMENT & PLAN:  55 year old Caucasian male, without significant past medical history, former smoker with 20 pack years smoking history, presented with abdominal pain, anorexia, weight loss.   1. 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 -His CT scan findings are highly suspicious for metastatic malignancy. There is no convincing primary tumor. His EGD and colonoscopy yesterday and was negative for primary tumor. -The 9 mm left upper lung nodule is very suspicious for a primary lung cancer, but not very typical as the primary tumor which causes the diffuse metastatic disease due to its small size. It also could be a metastatic lesion.  -His tumor marker showed significant elevated CEA and CA 19.9, which supports for a upper GI primary. -He is scheduled to have a PET scan  tomorrow, to further evaluate the extensiveness of metastatic disease -He is scheduled to have a CT-guided omental biopsy next week. Hopefully the immunostains will guild [place] to find the primary tumor -Further genomic testing will be based on the type of malignancy -Unfortunately, his malignancy is likely incurable, the prognosis and treatment plan will be determined by the type of malignancy.  2. Abdominal pain -He is taking Vicodin 2-5 tablets a day, the pain wakes him up at night -I suggest him to take extensor release hydrocodone, starting from low dose at night, and titrate as needed.  3. Anorexia and weight loss -I encouraged him to try nutritional supplement, such as Ensure or Boost  -NUTRITION consult  Recommendations: Based on information available as of  today's consult. Recommendations may change depending on the results of further tests or exams. 1) Proceed with diagnostic workup with biopsy and PET scan 2) Start long acting pain medication (ZOHYDRO) 20m at night, with short acting pain medication as needed. Hold on fentanyl which was prescribed by Dr. JArdis Hughs 3) Be sure to keep your bowels moving: Miralax daily and Senna-S (up to 4/day)  Next Steps: 1) PET scan 3/31 and Dr. MLisbeth Renshawon 07/14/15 2) Biopsy on 07/15/15 3) Return to discuss biopsy result on 4/6 or 4/7 4) Call for increase in pain or problem with bowel or bladder control/leg weakness   All questions were answered. The patient knows to call the clinic with any problems, questions or concerns. I spent 55 minutes counseling the patient face to face. The total time spent in the appointment was 60 minutes and more than 50% was on counseling.     FTruitt Merle MD 07/10/2015 8:25 AM

## 2015-07-10 NOTE — Telephone Encounter (Signed)
Can you come over and sign the rx today so the pt can pick it up?  If not, I will walk over.

## 2015-07-10 NOTE — Telephone Encounter (Signed)
Dr Ardis Hughs please advise the pt would like to have something for pain sent in to [facility] for abd and chest pain,  The pain is at least a 3/10 at all times but can increase to a 10/10.  He has not tried any medications for the pain.

## 2015-07-10 NOTE — Telephone Encounter (Signed)
i can't come over.  Maybe someone there can sign it?  If not, I'm in [facility] endo. thanks  He has newly diagnosed peritoneal carcinomatosis causing pain.

## 2015-07-10 NOTE — Telephone Encounter (Signed)
Ok, lets start him on fentanyl patch 65mcg per hour dose.  Use each patch for 3 days, disp one month, cannot put refills now but I'm happy to prescibe more if/when he needs more.

## 2015-07-11 ENCOUNTER — Encounter: Payer: Self-pay | Admitting: Hematology

## 2015-07-11 ENCOUNTER — Encounter (HOSPITAL_COMMUNITY)
Admission: RE | Admit: 2015-07-11 | Discharge: 2015-07-11 | Disposition: A | Discharge status: Home / Self Care | Payer: Commercial Managed Care - PPO | Source: Ambulatory Visit | Attending: Gastroenterology | Admitting: Gastroenterology

## 2015-07-11 DIAGNOSIS — C7951 Secondary malignant neoplasm of bone: Secondary | ICD-10-CM | POA: Insufficient documentation

## 2015-07-11 DIAGNOSIS — C786 Secondary malignant neoplasm of retroperitoneum and peritoneum: Secondary | ICD-10-CM | POA: Diagnosis not present

## 2015-07-11 DIAGNOSIS — R911 Solitary pulmonary nodule: Secondary | ICD-10-CM | POA: Insufficient documentation

## 2015-07-11 DIAGNOSIS — C762 Malignant neoplasm of abdomen: Secondary | ICD-10-CM | POA: Diagnosis present

## 2015-07-11 DIAGNOSIS — R180 Malignant ascites: Secondary | ICD-10-CM | POA: Diagnosis not present

## 2015-07-11 LAB — GLUCOSE, CAPILLARY: GLUCOSE-CAPILLARY: 134 mg/dL — AB (ref 65–99)

## 2015-07-11 MED ORDER — FLUDEOXYGLUCOSE F - 18 (FDG) INJECTION
9.2700 | Freq: Once | INTRAVENOUS | Status: AC | PRN
  Administered 2015-07-11: 9.27 via INTRAVENOUS

## 2015-07-11 NOTE — Progress Notes (Addendum)
Histology and Location of Primary Cancer: , Unknown Primary  Biopsy Esophagus 07/09/15 pending   Sites of Visceral and Bony Metastatic Disease: spinal mets referral from Dr. Rudene Anda  Location(s) of Symptomatic Metastases:  T9, T11   Past/Anticipated chemotherapy by medical oncology, if any: Dr. Burr Medico 07/10/15, Biopsy abdomen  scheduled 07/15/15  Return to discuss on 4/6 or 4/7 17   Pain on a scale of 0-10 is:  4/10 lower back, abdomen, and right shoulder, has a torn ligament right shoulder stated,    If Spine Met(s), symptoms, if any, include:  Bowel/Bladder retention or incontinence (please describe):constipated takes miralax, senna  Numbness or weakness in extremities (please describe no  Current Decadron regimen, if applicable: no  Ambulatory status? Walker? Wheelchair?:ambulatory  SAFETY ISSUES: no  Prior radiation? NO  Pacemaker/ICD? NO  Is the patient on methotrexate? 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  EXAM: NUCLEAR MEDICINE PET SKULL BASE TO THIGH  TECHNIQUE: 9.27 mCi F-18 FDG was injected intravenously. Full-ring PET imaging was performed from the skull base to thigh after the radiotracer. CT data was obtained and used for attenuation correction and anatomic localization.  FASTING BLOOD GLUCOSE: Value: 134 mg/dl  COMPARISON: None  FINDINGS: NECK  No hypermetabolic lymph nodes in the neck.  CHEST  No hypermetabolic mediastinal or hilar nodes. Left upper lobe pulmonary nodule measures 9 mm and has an SUV max equal to 3.4. There is a subpleural nodule within the posterior right lower lobe which measures 7 mm, image 40 of series 6. This is too small to reliably characterize by PET-CT. No significant FDG uptake is noted on the corresponding PET images however.  ABDOMEN/PELVIS  No abnormal hypermetabolic activity within the liver,  pancreas, adrenal glands, or spleen. 15 mm gallstone is identified. No hypermetabolic lymph nodes in the abdomen or pelvis. Moderate ascites is identified within the pelvis. There is increased activity within the ascites which suggest malignant ascites. There is evidence of extensive peritoneal carcinomatosis with omental caking. Right omental cake, image 144 of series 4, has an SUV max equal to 6.24.  SKELETON  Multifocal areas of hypermetabolic lytic bone metastases are identified. Index lesion within the right inferior pubic ramus has an SUV max equal to 15.29. Hypermetabolic lytic lesion involving the right femoral head has an SUV max equal to 9.64. Multiple hypermetabolic lytic are identified within the cervical, thoracic and lumbar spine. The most concerning lesion is at the T11 level. This has an SUV max equal to 13.59. This is situated within the posterior vertebral body and there is destruction along the posterior cortex. Canal involvement cannot be excluded. Expansile and destructive left second rib lesion is identified. This has an SUV max equal to 14.8. Hypermetabolic lesion involving the proximal right femur has an SUV max equal to 6.7.  IMPRESSION: 1. Examination is positive for multifocal hypermetabolic lytic bone metastases. Lesions are identified within the extremities, spine and pelvis. Most concerning is a lytic lesion involving the posterior aspect of the T11 vertebra. If there is a concern for canal involvement then an MRI with contrast material may be helpful for further assessment. 2. Hypermetabolic spiculated nodule within the left upper lobe is identified. This may represent a primary pulmonary malignancy or a focus of metastatic disease. 3. Evidence of peritoneal carcinomatosis with presumed malignant ascites and omental caking.     Married, Former smoker cigarettes,off and on for years, 1 ppd  Then 46  years quit 2007,  no alcohol or drug use,no  smokeless tobacco,  Mother Lung cancer,,Father lung cancer, both smokers, paternal grandmother uterine cancer to lung, non smoker, paternal uncles unknown cancers BP 124/82 mmHg  Pulse 113  Temp(Src) 97.7 F (36.5 C) (Oral)  Resp 20  Ht _0  (1.778 m)  Wt 186 lb 11.2 oz (84.687 kg)  BMI 26.79 kg/m2  SpO2 100%  Wt Readings from Last 3 Encounters:  07/14/15 186 lb 11.2 oz (84.687 kg)  07/10/15 187 lb 1.6 oz (84.868 kg)  07/09/15 187 lb (84.823 kg)  . Pain in lower back and abonmen,   Allergies: NKA Colonoscopy 07/09/15 Diagnosis 07/09/15: 1. Surgical [P], transverse, polyp - TUBULAR ADENOMA. - NO HIGH GRADE DYSPLASIA OR MALIGNANCY. 2. Surgical [P], GE junction - FINDINGS CONSISTENT WITH REFLUX. - NO INTESTINAL METAPLASIA, DYSPLASIA, OR MALIGNANCY.

## 2015-07-14 ENCOUNTER — Ambulatory Visit
Admission: RE | Admit: 2015-07-14 | Discharge: 2015-07-14 | Disposition: A | Discharge status: Home / Self Care | Payer: Commercial Managed Care - PPO | Source: Ambulatory Visit | Attending: Radiation Oncology | Admitting: Radiation Oncology

## 2015-07-14 ENCOUNTER — Encounter: Payer: Self-pay | Admitting: Radiation Oncology

## 2015-07-14 ENCOUNTER — Other Ambulatory Visit: Payer: Self-pay | Admitting: General Surgery

## 2015-07-14 VITALS — BP 124/82 | HR 113 | Temp 97.7°F | Resp 20 | Ht 70.0 in | Wt 186.7 lb

## 2015-07-14 DIAGNOSIS — C7951 Secondary malignant neoplasm of bone: Secondary | ICD-10-CM | POA: Diagnosis present

## 2015-07-14 DIAGNOSIS — C801 Malignant (primary) neoplasm, unspecified: Secondary | ICD-10-CM | POA: Insufficient documentation

## 2015-07-14 DIAGNOSIS — Z87891 Personal history of nicotine dependence: Secondary | ICD-10-CM | POA: Insufficient documentation

## 2015-07-14 DIAGNOSIS — Z801 Family history of malignant neoplasm of trachea, bronchus and lung: Secondary | ICD-10-CM | POA: Diagnosis not present

## 2015-07-14 DIAGNOSIS — C786 Secondary malignant neoplasm of retroperitoneum and peritoneum: Secondary | ICD-10-CM | POA: Insufficient documentation

## 2015-07-14 DIAGNOSIS — E119 Type 2 diabetes mellitus without complications: Secondary | ICD-10-CM | POA: Insufficient documentation

## 2015-07-14 DIAGNOSIS — Z510 Encounter for antineoplastic radiation therapy: Secondary | ICD-10-CM | POA: Insufficient documentation

## 2015-07-14 NOTE — Progress Notes (Signed)
Radiation Oncology         [phone] ________________________________  Name: Jonathon Cline MRN: [id number]  Date: 07/14/2015  DOB: 10-25-1960  CC:Scarlette Calico, MD  Milus Banister, MD     REFERRING PHYSICIAN: Milus Banister, MD   DIAGNOSIS: The primary encounter diagnosis was Bone metastasis [facility]). A diagnosis of Osseous metastasis ([place]) was also pertinent to this visit.  Peritoneal Carcinomatosis with Unknown Primary  HISTORY OF PRESENT ILLNESS::Jonathon Cline is a 55 y.o. male who is seen for an initial consultation visit regarding the patient's diagnosis of peritoneal carcinomatosis with unknown primary.  The patient presented to Dr. Ardis Hughs of Gastroenterology, on 07/04/15, with abdominal pain, constipation, weight loss, and elevated liver tests. CT of the abdomen and pelvis on 07/04/15 revealed two 3 mm pulmonary nodules in the posterior right lower lobe, finding of peritoneal carcinomatosis, a 1.8 x 1.1 cm osseous lesion in the T11 vertebral body, a 1.2 x 0.9 osseous lesion in the T9 vertebral body, and wall thickening in the gastric antrum and lower thoracic esophagus. A colonoscopy on 07/09/15 revealed a 9 mm polyp in the transverse colon. The polyp was resected and revealed tubular adenoma with no high grade dysplasia or malignancy. An upper GI endoscopy the same day noted LA Grade A esophagitis with no bleeding at the GE junction. Biopsies were consistent with reflux and no intestinal metaplasia, dysplasia, or malignancy. CT of the chest w/ contrast on 07/10/15 showed a 6 mm mediastinal lymph node in the lateral aortic region, a 9 mm pulmonary nodule in the left upper lobe, a 5 mm right upper lobe nodule, multiple sub-centimeter pulmonary nodules in the right lower lobe, a lytic bone lesion in the left lateral second rib, multiple small sclerotic bone lesions involving the cervical and thoracic vertebral bodies, and upper abdominal peritoneal carcinomatosis. His tumor marker showed  significantly elevated CEA and CA 19.9, which supports for a upper GI primary. The patient saw Dr. Burr Medico on 07/10/15 who recommended the patient to see radiation oncology for local treatment options. PET scan on 07/11/15 was positive for multifocal hypermetabolic bone metastases, a hypermetabolic left upper lobe pulmonary nodule, and evidence of peritoneal carcinomatosis with presumed malignant ascites and omental caking.   PREVIOUS RADIATION THERAPY: No   PAST MEDICAL HISTORY:  has a past medical history of Diabetes ([place]).     PAST SURGICAL HISTORY: Past Surgical History  Procedure Laterality Date  . Spine surgery      cervical fusion  . Tonsillectomy       FAMILY HISTORY: family history includes Cancer in his other; Cancer (age of onset: 3) in his paternal grandmother; Lung cancer in his father and mother. There is no history of Colon cancer, Esophageal cancer, or Stomach cancer.   SOCIAL HISTORY:  reports that he quit smoking about 11 years ago. He has never used smokeless tobacco. He reports that he does not drink alcohol or use illicit drugs.   ALLERGIES: Review of patient's allergies indicates no known allergies.   MEDICATIONS:  Current Outpatient Prescriptions  Medication Sig Dispense Refill  . bisacodyl (DULCOLAX) 5 MG EC tablet Take 5 mg by mouth daily as needed for moderate constipation. Reported on 07/10/2015    . blood glucose meter kit and supplies KIT Dispense based on patient and insurance preference. Use up to four times daily as directed. E11.65 1 each 0  . Docusate Sodium (DULCOLAX STOOL SOFTENER PO) Take by mouth as needed. Reported on 07/10/2015    .  HYDROcodone-acetaminophen (NORCO/VICODIN) 5-325 MG tablet Take 1 tablet by mouth every 6 (six) hours as needed for moderate pain. 60 tablet 0  . metFORMIN (GLUCOPHAGE) 500 MG tablet Take 1 tablet (500 mg total) by mouth 2 (two) times daily with a meal. 60 tablet 0  . fentaNYL (DURAGESIC - DOSED MCG/HR) 25 MCG/HR patch  Place 1 patch (25 mcg total) onto the skin every 3 (three) days. (Patient not taking: Reported on 07/10/2015) 5 patch 0  . HYDROcodone Bitartrate (ZOHYDRO ER) 10 MG C12A Take 10 mg by mouth every 12 (twelve) hours. 20 each 0   No current facility-administered medications for this encounter.     REVIEW OF SYSTEMS:  A 15 point review of systems is documented in the electronic medical record. This was obtained by the nursing staff. However, I reviewed this with the patient to discuss relevant findings and make appropriate changes.  Pertinent items are noted in HPI.  The patient has 4/10 pain in his lower back and abdomen. He also has right shoulder pain and he explains that this is due to a torn ligament. The patient reports constipation and is taking miralax and senna for this. He describes no pain in his legs. Denies nausea or emesis. He describes a new onset of blurry vision  PHYSICAL EXAM:  height is _0  (1.778 m) and weight is 186 lb 11.2 oz (84.687 kg). His oral temperature is 97.7 F (36.5 C). His blood pressure is 124/82 and his pulse is 113. His respiration is 20 and oxygen saturation is 100%.    Lungs are clear to auscultation bilaterally. Heart has regular rate and rhythm. Hypoactive bowel sounds  ECOG = 1  0 - Asymptomatic (Fully active, able to carry on all predisease activities without restriction)  1 - Symptomatic but completely ambulatory (Restricted in physically strenuous activity but ambulatory and able to carry out work of a light or sedentary nature. For example, light housework, office work)  2 - Symptomatic, <50% in bed during the day (Ambulatory and capable of all self care but unable to carry out any work activities. Up and about more than 50% of waking hours)  3 - Symptomatic, >50% in bed, but not bedbound (Capable of only limited self-care, confined to bed or chair 50% or more of waking hours)  4 - Bedbound (Completely disabled. Cannot carry on any self-care.  Totally confined to bed or chair)  5 - Death   Eustace Pen MM, Creech RH, Tormey DC, et al. [phone]). "Toxicity and response criteria of the [facility]". [place] Oncol. 5 (6): 649-55    LABORATORY DATA:  Lab Results  Component Value Date   WBC 10.0 07/04/2015   HGB 15.4 07/04/2015   HCT 45.0 07/04/2015   MCV 84.8 07/04/2015   PLT 389.0 07/04/2015   Lab Results  Component Value Date   NA 138 07/04/2015   K 4.4 07/04/2015   CL 98 07/04/2015   CO2 32 07/04/2015   Lab Results  Component Value Date   ALT 36 07/04/2015   AST 15 07/04/2015   ALKPHOS 97 07/04/2015   BILITOT 0.8 07/04/2015      RADIOGRAPHY: Ct Chest W Contrast  07/10/2015  CLINICAL DATA:  Peritoneal carcinomatosis. Unknown primary. Indeterminate right lung nodule seen on recent abdomen CT. EXAM: CT CHEST WITH CONTRAST TECHNIQUE: Multidetector CT imaging of the chest was performed during intravenous contrast administration. CONTRAST:  67m ISOVUE-300 IOPAMIDOL (ISOVUE-300) INJECTION 61% COMPARISON:  Abdomen pelvis CT on 07/04/2015  FINDINGS: Mediastinum/Lymph Nodes: A 6 mm mediastinal lymph node is seen in the lateral aortic region on image 23/series 2 which is not pathologically enlarged. No pathologically enlarged nodes identified. No hilar or axillary lymphadenopathy identified. Lungs/Pleura: A 9 mm pulmonary nodule seen in the anterior left upper lobe which show spiculated margins on image 20 of series 5. The spiculated margins of this lesion are worrisome for a primary bronchogenic carcinoma. A perifissural nodule is also seen in the right upper lobe measuring 5 mm, which is indeterminate. Other tiny sub-cm pulmonary nodules are seen in the posterior right lower lobe which are also indeterminate. No evidence of pleural effusion. Upper abdomen: Mild ascites again seen within the upper abdomen as well as soft tissue stranding in the omentum, consistent with peritoneal carcinomatosis. Hepatic steatosis  and cholelithiasis again demonstrated. Musculoskeletal: Of expansile lytic bone lesion is seen involving the left lateral second rib. In addition, there are multiple small sclerotic bone lesions involving the cervical and thoracic vertebral bodies, largest at level of T11, consistent with lytic bone metastases. IMPRESSION: 9 mm spiculated pulmonary nodule in left upper lobe, with morphology suspicious for a primary bronchogenic carcinoma. 6 mm mediastinal lymph node in the lateral aortic region noted, but not considered pathologically enlarged. Consider PET-CT for further evaluation. Indeterminate sub-cm right lung nodules, largest measuring 5 mm. Continued attention recommended on follow-up imaging. 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 Findings of upper abdominal peritoneal carcinomatosis again noted. Electronically Signed   By: Earle Gell M.D.   On: 07/10/2015 13:58   Ct Abdomen Pelvis W Contrast  07/04/2015  CLINICAL DATA:  Lower abdominal pain, intermittent diarrhea and nausea and weight loss. EXAM: CT ABDOMEN AND PELVIS WITH CONTRAST TECHNIQUE: Multidetector CT imaging of the abdomen and pelvis was performed using the standard protocol following bolus administration of intravenous contrast. CONTRAST:  187m ISOVUE-300 IOPAMIDOL (ISOVUE-300) INJECTION 61% COMPARISON:  08/15/2006 renal sonogram. 06/23/2015 abdominal radiographs. No prior CT abdomen/pelvis. FINDINGS: Lower chest: There are two 3 mm solid pulmonary nodules in the posterior right lower lobe (series 5/ images 3 and 4). Left anterior descending coronary atherosclerosis. Mild circumferential wall thickening in the lower thoracic esophagus. Hepatobiliary: Diffuse hepatic steatosis. No liver mass. Calcified 1.5 cm gallstone layering within the nondistended gallbladder, with no gallbladder wall thickening or pericholecystic fluid. No biliary ductal dilatation. Pancreas: Normal, with no mass or duct dilation. Spleen: Normal  size. No mass. Adrenals/Urinary Tract: Normal adrenals. Hypodense 0.8 cm renal cortical lesion in the posterior interpolar left kidney, too small to characterize, for which no further follow-up is required. Otherwise normal kidneys, with no hydronephrosis. Normal bladder. Stomach/Bowel: The stomach is collapsed and not well evaluated. There is questionable wall thickening in the gastric antrum. Normal caliber small bowel with no small bowel wall thickening. The appendix is not discretely visualized and is probably surgically absent. Normal large bowel with no diverticulosis, large bowel wall thickening or pericolonic fat stranding. Oral contrast progresses to the distal colon. Vascular/Lymphatic: Atherosclerotic nonaneurysmal abdominal aorta. No pathologically enlarged lymph nodes in the abdomen or pelvis. Reproductive: Mild prostatomegaly. Other: There is trace ascites in the pelvis. There is abnormal mild thickening and enhancement of the pelvic peritoneum. There is extensive soft tissue caking of the entire omentum, most prominent in the right omentum. There is irregular nodular peritoneal thickening in the bilateral paracolic gutters. There is scattered nodularity throughout the mesentery, with the largest mesenteric nodule measuring 1.7 x 1.4 cm (series 2/image 59). Musculoskeletal: There is a  1.8 x 1.1 cm lytic osseous lesion in the posterior T11 vertebral body (series 603/ image 62), with probable minimal soft tissue component extending 2-3 mm into the anterior thoracic spinal canal at this level. There is a 1.2 x 0.9 cm lytic osseous lesion in the anterior T9 vertebral body. IMPRESSION: 1. Findings of peritoneal carcinomatosis, including omental caking, mesenteric nodularity, nodular peritoneal thickening in the bilateral paracolic gutters, thickening and enhancement of the pelvic peritoneum and trace pelvic ascites. 2. Lytic osseous lesions in the T9 and T11 vertebral bodies worrisome for osseous  metastases. The T11 osseous lesion appears to minimally efface the thoracic spinal canal anteriorly at this level, without CT evidence of thoracic cord compression. Correlate with thoracic MRI as clinically warranted. 3. A definite primary malignancy is not evident. Nonspecific mild circumferential wall thickening in the lower thoracic esophagus, and questionable wall thickening in the gastric antrum, cannot exclude neoplasm in these locations. Consider further evaluation with upper and lower endoscopy and PET-CT. 4. Two 3 mm pulmonary nodules in the right lower lobe, indeterminate. 5. Additional findings include coronary atherosclerosis, diffuse hepatic steatosis, cholelithiasis and mild prostatomegaly. These results will be called to the ordering clinician or representative by the Radiologist Assistant, and communication documented in the PACS or zVision Dashboard. Electronically Signed   By: Ilona Sorrel M.D.   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 EXAM: NUCLEAR MEDICINE PET SKULL BASE TO THIGH TECHNIQUE: 9.27 mCi F-18 FDG was injected intravenously. Full-ring PET imaging was performed from the skull base to thigh after the radiotracer. CT data was obtained and used for attenuation correction and anatomic localization. FASTING BLOOD GLUCOSE:  Value: 134 mg/dl COMPARISON:  None FINDINGS: NECK No hypermetabolic lymph nodes in the neck. CHEST No hypermetabolic mediastinal or hilar nodes. Left upper lobe pulmonary nodule measures 9 mm and has an SUV max equal to 3.4. There is a subpleural nodule within the posterior right lower lobe which measures 7 mm, image 40 of series 6. This is too small to reliably characterize by PET-CT. No significant FDG uptake is noted on the corresponding PET images however. ABDOMEN/PELVIS No abnormal hypermetabolic activity within the liver, pancreas, adrenal  glands, or spleen. 15 mm gallstone is identified. No hypermetabolic lymph nodes in the abdomen or pelvis. Moderate ascites is identified within the pelvis. There is increased activity within the ascites which suggest malignant ascites. There is evidence of extensive peritoneal carcinomatosis with omental caking. Right omental cake, image 144 of series 4, has an SUV max equal to 6.24. SKELETON Multifocal areas of hypermetabolic lytic bone metastases are identified. Index lesion within the right inferior pubic ramus has an SUV max equal to 15.29. Hypermetabolic lytic lesion involving the right femoral head has an SUV max equal to 9.64. Multiple hypermetabolic lytic are identified within the cervical, thoracic and lumbar spine. The most concerning lesion is at the T11 level. This has an SUV max equal to 13.59. This is situated within the posterior vertebral body and there is destruction along the posterior cortex. Canal involvement cannot be excluded. Expansile and destructive left second rib lesion is identified. This has an SUV max equal to 14.8. Hypermetabolic lesion involving the proximal right femur has an SUV max equal to 6.7. IMPRESSION: 1. Examination is positive for multifocal hypermetabolic lytic bone metastases. Lesions are identified within the extremities, spine and pelvis. Most concerning is a lytic lesion  involving the posterior aspect of the T11 vertebra. If there is a concern for canal involvement then an MRI with contrast material may be helpful for further assessment. 2. Hypermetabolic spiculated nodule within the left upper lobe is identified. This may represent a primary pulmonary malignancy or a focus of metastatic disease. 3. Evidence of peritoneal carcinomatosis with presumed malignant ascites and omental caking. Electronically Signed   By: Kerby Moors M.D.   On: 07/11/2015 10:31   Dg Abd 2 Views  06/23/2015  CLINICAL DATA:  Abdominal pain, lump in RIGHT abdomen, testicular pain with bowel  movement, question hernia, worsened diffuse abdominal pain since heavy lifting 3 weeks ago EXAM: ABDOMEN - 2 VIEW COMPARISON:  None FINDINGS: Normal bowel gas pattern. No bowel dilatation or bowel wall thickening. Osseous structures unremarkable. No urinary tract calcification. No extension of bowel gas into the RIGHT inguinal region is evident. IMPRESSION: Normal exam. Electronically Signed   By: Lavonia Dana M.D.   On: 06/23/2015 18:08    IMPRESSION: Peritoneal Carcinomatosis with Unknown Primary. The patient is a good candidate for palliative radiation to the lytic bone lesion in the left lateral second rib, the right hip region, and the T9 and T11 vertebral bodies. I explained the process of CT simulation and the placement of tattoos. I explained the process of radiation treatment and side effects that may arise.  The patient I believe therefore would be a good candidate for palliative radiation treatment to several sites. Each of these aren't risk of causing significant pain or other symptoms in the perceivable future. I discussed the rationale of such a treatment with the patient in some detail. This can be combined with his systemic treatment as well as I would not want to delay this given his diffuse disease.   PLAN: The patient is scheduled for a CT-guided omental biopsy tomorrow. He is scheduled to return to Dr. Burr Medico, on Thursday, to discuss possible systemic treatment options. I advised the patient that since he is taking pain medication, he should take a stool softener routinely with miralax as needed. The patient plans to see his PCP next Wednesday. We will scheduled CT simulation in the near future and begin treatment afterwards.  I would anticipate a 2-3 week course of treatment, 3 weeks if the patient is going to begin systemic treatment in the near future.  ________________________________   Jodelle Gross, MD, PhD  This document serves as a record of services personally performed by  Kyung Rudd, MD. It was created on his behalf by Darcus Austin, a trained medical scribe. The creation of this record is based on the scribe's personal observations and the provider's statements to them. This document has been checked and approved by the attending provider.

## 2015-07-14 NOTE — Progress Notes (Signed)
Please see the Nurse Progress Note in the MD Initial Consult Encounter for this patient. 

## 2015-07-15 ENCOUNTER — Ambulatory Visit (HOSPITAL_COMMUNITY)
Admission: RE | Admit: 2015-07-15 | Discharge: 2015-07-15 | Disposition: A | Discharge status: Home / Self Care | Payer: Commercial Managed Care - PPO | Source: Ambulatory Visit | Attending: Gastroenterology | Admitting: Gastroenterology

## 2015-07-15 ENCOUNTER — Encounter (HOSPITAL_COMMUNITY): Payer: Self-pay

## 2015-07-15 DIAGNOSIS — R911 Solitary pulmonary nodule: Secondary | ICD-10-CM | POA: Insufficient documentation

## 2015-07-15 DIAGNOSIS — Z87891 Personal history of nicotine dependence: Secondary | ICD-10-CM | POA: Diagnosis not present

## 2015-07-15 DIAGNOSIS — Z7984 Long term (current) use of oral hypoglycemic drugs: Secondary | ICD-10-CM | POA: Insufficient documentation

## 2015-07-15 DIAGNOSIS — M899 Disorder of bone, unspecified: Secondary | ICD-10-CM | POA: Diagnosis not present

## 2015-07-15 DIAGNOSIS — C762 Malignant neoplasm of abdomen: Secondary | ICD-10-CM | POA: Diagnosis not present

## 2015-07-15 DIAGNOSIS — K760 Fatty (change of) liver, not elsewhere classified: Secondary | ICD-10-CM | POA: Insufficient documentation

## 2015-07-15 DIAGNOSIS — E119 Type 2 diabetes mellitus without complications: Secondary | ICD-10-CM | POA: Insufficient documentation

## 2015-07-15 DIAGNOSIS — K8020 Calculus of gallbladder without cholecystitis without obstruction: Secondary | ICD-10-CM | POA: Insufficient documentation

## 2015-07-15 DIAGNOSIS — R188 Other ascites: Secondary | ICD-10-CM | POA: Diagnosis not present

## 2015-07-15 LAB — GLUCOSE, CAPILLARY: GLUCOSE-CAPILLARY: 153 mg/dL — AB (ref 65–99)

## 2015-07-15 LAB — CBC
HEMATOCRIT: 39.2 % (ref 39.0–52.0)
HEMOGLOBIN: 13.9 g/dL (ref 13.0–17.0)
MCH: 29.3 pg (ref 26.0–34.0)
MCHC: 35.5 g/dL (ref 30.0–36.0)
MCV: 82.5 fL (ref 78.0–100.0)
Platelets: 406 10*3/uL — ABNORMAL HIGH (ref 150–400)
RBC: 4.75 MIL/uL (ref 4.22–5.81)
RDW: 11.9 % (ref 11.5–15.5)
WBC: 9.7 10*3/uL (ref 4.0–10.5)

## 2015-07-15 LAB — PROTIME-INR
INR: 1 (ref 0.00–1.49)
PROTHROMBIN TIME: 13.4 s (ref 11.6–15.2)

## 2015-07-15 LAB — APTT: APTT: 32 s (ref 24–37)

## 2015-07-15 MED ORDER — HYDROCODONE-ACETAMINOPHEN 5-325 MG PO TABS
1.0000 | ORAL_TABLET | ORAL | Status: DC | PRN
  Administered 2015-07-15: 1 via ORAL
  Filled 2015-07-15: qty 2
  Filled 2015-07-15: qty 1
  Filled 2015-07-15: qty 2

## 2015-07-15 MED ORDER — SODIUM CHLORIDE 0.9 % IV SOLN
INTRAVENOUS | Status: DC
  Administered 2015-07-15: 08:00:00 via INTRAVENOUS

## 2015-07-15 MED ORDER — FENTANYL CITRATE (PF) 100 MCG/2ML IJ SOLN
INTRAMUSCULAR | Status: AC | PRN
  Administered 2015-07-15: 50 ug via INTRAVENOUS

## 2015-07-15 MED ORDER — MIDAZOLAM HCL 2 MG/2ML IJ SOLN
INTRAMUSCULAR | Status: AC | PRN
  Administered 2015-07-15 (×2): 1 mg via INTRAVENOUS

## 2015-07-15 MED ORDER — MIDAZOLAM HCL 2 MG/2ML IJ SOLN
INTRAMUSCULAR | Status: AC
  Filled 2015-07-15: qty 6

## 2015-07-15 MED ORDER — FENTANYL CITRATE (PF) 100 MCG/2ML IJ SOLN
INTRAMUSCULAR | Status: AC
  Filled 2015-07-15: qty 4

## 2015-07-15 NOTE — Procedures (Signed)
CT core biopsy R omentum 18g x4 to surg path No complication No blood loss. See complete dictation in [facility].

## 2015-07-15 NOTE — Sedation Documentation (Signed)
Patient denies pain and is resting comfortably.  

## 2015-07-15 NOTE — H&P (Signed)
Chief Complaint: Patient was seen in consultation today for omental mass biopsy at the request of [facility]  Referring Physician(s): Milus Banister  Supervising Physician: Arne Cleveland  History of Present Illness: Jonathon Cline is a 55 y.o. male with abnormal CT scan findings suspicious for malignancy He underwent colonoscopy and EGD with biopsies which were benign. He is now scheduled for CT guided biopsy of omental mass PMHx, meds, labs, imaging, allergies reviewed. Has been NPO this am Wife at bedside.  Past Medical History  Diagnosis Date  . Diabetes [facility])     Past Surgical History  Procedure Laterality Date  . Spine surgery      cervical fusion  . Tonsillectomy      Allergies: Review of patient's allergies indicates no known allergies.  Medications: Prior to Admission medications   Medication Sig Start Date End Date Taking? Authorizing Provider  bisacodyl (DULCOLAX) 5 MG EC tablet Take 5 mg by mouth daily as needed for moderate constipation. Reported on 07/10/2015   Yes Historical Provider, MD  Docusate Sodium (DULCOLAX STOOL SOFTENER PO) Take by mouth as needed. Reported on 07/10/2015   Yes Historical Provider, MD  HYDROcodone Bitartrate (ZOHYDRO ER) 10 MG C12A Take 10 mg by mouth every 12 (twelve) hours. 07/10/15  Yes Truitt Merle, MD  HYDROcodone-acetaminophen (NORCO/VICODIN) 5-325 MG tablet Take 1 tablet by mouth every 6 (six) hours as needed for moderate pain. 07/10/15  Yes Truitt Merle, MD  metFORMIN (GLUCOPHAGE) 500 MG tablet Take 1 tablet (500 mg total) by mouth 2 (two) times daily with a meal. 06/23/15  Yes Shawnee Knapp, MD  polyethylene glycol powder (MIRALAX) powder Take 1 Container by mouth as needed.   Yes Historical Provider, MD  senna (SENOKOT) 8.6 MG TABS tablet Take 1 tablet by mouth as needed for mild constipation.   Yes Historical Provider, MD  blood glucose meter kit and supplies KIT Dispense based on patient and insurance preference. Use up to four  times daily as directed. E11.65 06/23/15   Shawnee Knapp, MD  fentaNYL (DURAGESIC - DOSED MCG/HR) 25 MCG/HR patch Place 1 patch (25 mcg total) onto the skin every 3 (three) days. Patient not taking: Reported on 07/10/2015 07/10/15   Milus Banister, MD     Family History  Problem Relation Age of Onset  . Cancer Other     Lung  . Lung cancer Mother   . Lung cancer Father   . Colon cancer Neg Hx   . Esophageal cancer Neg Hx   . Stomach cancer Neg Hx   . Cancer Paternal Grandmother 26    ovarian cancer     Social History   Social History  . Marital Status: Married    Spouse Name: N/A  . Number of Children: N/A  . Years of Education: N/A   Social History Main Topics  . Smoking status: Former Smoker -- 1.00 packs/day for 20 years    Quit date: 04/12/2004  . Smokeless tobacco: Never Used  . Alcohol Use: No     Comment: social drinker   . Drug Use: No  . Sexual Activity: Not Asked   Other Topics Concern  . 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  All other systems are negative.  Review of Systems  Vital Signs: Temp: 98.8, HR: 112, BP: 138/88, RR: 18  Physical Exam  Constitutional: He is oriented to person, place, and time. He appears well-developed and well-nourished. No distress.  HENT:  Head: Normocephalic.  Mouth/Throat: Oropharynx is clear and moist.  Neck: Normal range of motion. No tracheal deviation present.  Cardiovascular: Normal rate, regular rhythm and normal heart sounds.   Pulmonary/Chest: Effort normal and breath sounds normal. No respiratory distress.    Abdominal: Soft. There is no tenderness.  Neurological: He is alert and oriented to person, place, and time.  Psychiatric: He has a normal mood and affect.    Mallampati Score:  MD Evaluation Airway: WNL Heart: WNL Abdomen: WNL Chest/ Lungs: WNL ASA  Classification: 2 Mallampati/Airway Score: One  Imaging: Ct Chest W Contrast  07/10/2015  CLINICAL DATA:  Peritoneal carcinomatosis. Unknown primary. Indeterminate right lung nodule seen on recent abdomen CT. EXAM: CT CHEST WITH CONTRAST TECHNIQUE: Multidetector CT imaging of the chest was performed during intravenous contrast administration. CONTRAST:  70m ISOVUE-300 IOPAMIDOL (ISOVUE-300) INJECTION 61% COMPARISON:  Abdomen pelvis CT on 07/04/2015 FINDINGS: Mediastinum/Lymph Nodes: A 6 mm mediastinal lymph node is seen in the lateral aortic region on image 23/series 2 which is not pathologically enlarged. No pathologically enlarged nodes identified. No hilar or axillary lymphadenopathy identified. Lungs/Pleura: A 9 mm pulmonary nodule seen in the anterior left upper lobe which show spiculated margins on image 20 of series 5. The spiculated margins of this lesion are worrisome for a primary bronchogenic carcinoma. A perifissural nodule is also seen in the right upper lobe measuring 5 mm, which is indeterminate. Other tiny sub-cm pulmonary nodules are seen in the posterior right lower lobe which are also indeterminate. No evidence of pleural effusion. Upper abdomen: Mild ascites again seen within the upper abdomen as well as soft tissue stranding in the omentum, consistent with peritoneal carcinomatosis. Hepatic steatosis and cholelithiasis again demonstrated. Musculoskeletal: Of expansile lytic bone lesion is seen involving the left lateral second rib. In addition, there are multiple small sclerotic bone lesions involving the cervical and thoracic vertebral bodies, largest at level of T11, consistent with lytic bone metastases. IMPRESSION: 9 mm  spiculated pulmonary nodule in left upper lobe, with morphology suspicious for a primary bronchogenic carcinoma. 6 mm mediastinal lymph node in the lateral aortic region noted, but not considered pathologically enlarged. Consider PET-CT for further evaluation. Indeterminate sub-cm right lung nodules, largest measuring 5 mm. Continued attention recommended on follow-up imaging. 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 Findings of upper abdominal peritoneal carcinomatosis again noted. Electronically Signed   By: JEarle GellM.D.   On: 07/10/2015 13:58   Ct Abdomen Pelvis W Contrast  07/04/2015  CLINICAL DATA:  Lower abdominal pain, intermittent diarrhea and nausea and weight loss. EXAM: CT ABDOMEN AND PELVIS WITH CONTRAST TECHNIQUE: Multidetector CT imaging of the abdomen and pelvis was performed using the standard protocol following bolus administration of intravenous contrast. CONTRAST:  1086mISOVUE-300 IOPAMIDOL (ISOVUE-300) INJECTION 61% COMPARISON:  08/15/2006 renal sonogram. 06/23/2015 abdominal radiographs. No prior CT abdomen/pelvis. FINDINGS: Lower chest: There are two 3 mm solid pulmonary nodules in the posterior right lower lobe (series 5/ images 3 and 4). Left anterior descending coronary atherosclerosis. Mild circumferential wall thickening in the lower thoracic esophagus. Hepatobiliary: Diffuse  hepatic steatosis. No liver mass. Calcified 1.5 cm gallstone layering within the nondistended gallbladder, with no gallbladder wall thickening or pericholecystic fluid. No biliary ductal dilatation. Pancreas: Normal, with no mass or duct dilation. Spleen: Normal size. No mass. Adrenals/Urinary Tract: Normal adrenals. Hypodense 0.8 cm renal cortical lesion in the posterior interpolar left kidney, too small to characterize, for which no further follow-up is required. Otherwise normal kidneys, with no hydronephrosis. Normal bladder. Stomach/Bowel: The stomach is collapsed and not well  evaluated. There is questionable wall thickening in the gastric antrum. Normal caliber small bowel with no small bowel wall thickening. The appendix is not discretely visualized and is probably surgically absent. Normal large bowel with no diverticulosis, large bowel wall thickening or pericolonic fat stranding. Oral contrast progresses to the distal colon. Vascular/Lymphatic: Atherosclerotic nonaneurysmal abdominal aorta. No pathologically enlarged lymph nodes in the abdomen or pelvis. Reproductive: Mild prostatomegaly. Other: There is trace ascites in the pelvis. There is abnormal mild thickening and enhancement of the pelvic peritoneum. There is extensive soft tissue caking of the entire omentum, most prominent in the right omentum. There is irregular nodular peritoneal thickening in the bilateral paracolic gutters. There is scattered nodularity throughout the mesentery, with the largest mesenteric nodule measuring 1.7 x 1.4 cm (series 2/image 59). Musculoskeletal: There is a 1.8 x 1.1 cm lytic osseous lesion in the posterior T11 vertebral body (series 603/ image 62), with probable minimal soft tissue component extending 2-3 mm into the anterior thoracic spinal canal at this level. There is a 1.2 x 0.9 cm lytic osseous lesion in the anterior T9 vertebral body. IMPRESSION: 1. Findings of peritoneal carcinomatosis, including omental caking, mesenteric nodularity, nodular peritoneal thickening in the bilateral paracolic gutters, thickening and enhancement of the pelvic peritoneum and trace pelvic ascites. 2. Lytic osseous lesions in the T9 and T11 vertebral bodies worrisome for osseous metastases. The T11 osseous lesion appears to minimally efface the thoracic spinal canal anteriorly at this level, without CT evidence of thoracic cord compression. Correlate with thoracic MRI as clinically warranted. 3. A definite primary malignancy is not evident. Nonspecific mild circumferential wall thickening in the lower  thoracic esophagus, and questionable wall thickening in the gastric antrum, cannot exclude neoplasm in these locations. Consider further evaluation with upper and lower endoscopy and PET-CT. 4. Two 3 mm pulmonary nodules in the right lower lobe, indeterminate. 5. Additional findings include coronary atherosclerosis, diffuse hepatic steatosis, cholelithiasis and mild prostatomegaly. These results will be called to the ordering clinician or representative by the Radiologist Assistant, and communication documented in the PACS or zVision Dashboard. Electronically Signed   By: Ilona Sorrel M.D.   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 EXAM: NUCLEAR MEDICINE PET SKULL BASE TO THIGH TECHNIQUE: 9.27 mCi F-18 FDG was injected intravenously. Full-ring PET imaging was performed from the skull base to thigh after the radiotracer. CT data was obtained and used for attenuation correction and anatomic localization. FASTING BLOOD GLUCOSE:  Value: 134 mg/dl COMPARISON:  None FINDINGS: NECK No hypermetabolic lymph nodes in the neck. CHEST No hypermetabolic mediastinal or hilar nodes. Left upper lobe pulmonary nodule measures 9 mm and has an SUV max equal to 3.4. There is a subpleural nodule within the posterior right lower lobe which measures 7 mm, image 40 of series 6. This is too small to reliably characterize by PET-CT. No significant FDG uptake is noted on  the corresponding PET images however. ABDOMEN/PELVIS No abnormal hypermetabolic activity within the liver, pancreas, adrenal glands, or spleen. 15 mm gallstone is identified. No hypermetabolic lymph nodes in the abdomen or pelvis. Moderate ascites is identified within the pelvis. There is increased activity within the ascites which suggest malignant ascites. There is evidence of extensive peritoneal carcinomatosis with omental caking. Right omental  cake, image 144 of series 4, has an SUV max equal to 6.24. SKELETON Multifocal areas of hypermetabolic lytic bone metastases are identified. Index lesion within the right inferior pubic ramus has an SUV max equal to 15.29. Hypermetabolic lytic lesion involving the right femoral head has an SUV max equal to 9.64. Multiple hypermetabolic lytic are identified within the cervical, thoracic and lumbar spine. The most concerning lesion is at the T11 level. This has an SUV max equal to 13.59. This is situated within the posterior vertebral body and there is destruction along the posterior cortex. Canal involvement cannot be excluded. Expansile and destructive left second rib lesion is identified. This has an SUV max equal to 14.8. Hypermetabolic lesion involving the proximal right femur has an SUV max equal to 6.7. IMPRESSION: 1. Examination is positive for multifocal hypermetabolic lytic bone metastases. Lesions are identified within the extremities, spine and pelvis. Most concerning is a lytic lesion involving the posterior aspect of the T11 vertebra. If there is a concern for canal involvement then an MRI with contrast material may be helpful for further assessment. 2. Hypermetabolic spiculated nodule within the left upper lobe is identified. This may represent a primary pulmonary malignancy or a focus of metastatic disease. 3. Evidence of peritoneal carcinomatosis with presumed malignant ascites and omental caking. Electronically Signed   By: Kerby Moors M.D.   On: 07/11/2015 10:31   Dg Abd 2 Views  06/23/2015  CLINICAL DATA:  Abdominal pain, lump in RIGHT abdomen, testicular pain with bowel movement, question hernia, worsened diffuse abdominal pain since heavy lifting 3 weeks ago EXAM: ABDOMEN - 2 VIEW COMPARISON:  None FINDINGS: Normal bowel gas pattern. No bowel dilatation or bowel wall thickening. Osseous structures unremarkable. No urinary tract calcification. No extension of bowel gas into the RIGHT  inguinal region is evident. IMPRESSION: Normal exam. Electronically Signed   By: Lavonia Dana M.D.   On: 06/23/2015 18:08    Labs:  CBC:  Recent Labs  06/23/15 1843 07/04/15 1201 07/15/15 0720  WBC 9.0 10.0 9.7  HGB 16.5 15.4 13.9  HCT 46.0 45.0 39.2  PLT  --  389.0 406*    COAGS:  Recent Labs  07/04/15 1201 07/15/15 0720  INR 1.2* 1.00  APTT  --  32    BMP:  Recent Labs  06/23/15 1823 07/04/15 1201  NA 137 138  K 4.1 4.4  CL 97* 98  CO2 32* 32  GLUCOSE 326* 169*  BUN 15 11  CALCIUM 9.8 10.0  CREATININE 0.84 1.02    LIVER FUNCTION TESTS:  Recent Labs  06/23/15 1823 07/04/15 1201  BILITOT 0.8 0.8  AST 45* 15  ALT 68* 36  ALKPHOS 129* 97  PROT 7.5 7.5  ALBUMIN 4.4 4.2    TUMOR MARKERS:  Recent Labs  07/07/15 0851  CEA 147.3*  CA199 271*    Assessment and Plan: Abnormal imaging studies suspicious for metastatic process. For CT guided biopsy of omental mass/caking Labs ok Risks and Benefits discussed with the patient including, but not limited to bleeding, infection, damage to adjacent structures or low yield requiring additional tests. All of  the patient's questions were answered, patient is agreeable to proceed. Consent signed and in chart.    Thank you for this interesting consult.  A copy of this report was sent to the requesting provider on this date.  Electronically Signed: Ascencion Dike 07/15/2015, 8:33 AM   I spent a total of 20 minutes in face to face in clinical consultation, greater than 50% of which was counseling/coordinating care for omental mass biopsy

## 2015-07-15 NOTE — Discharge Instructions (Signed)
Needle Biopsy, Care After °These instructions give you information about caring for yourself after your procedure. Your doctor may also give you more specific instructions. Call your doctor if you have any problems or questions after your procedure. °HOME CARE °· Rest as told by your doctor. °· Take medicines only as told by your doctor. °· There are many different ways to close and cover the biopsy site, including stitches (sutures), skin glue, and adhesive strips. Follow instructions from your doctor about: °· How to take care of your biopsy site. °· When and how you should change your bandage (dressing). °· When you should remove your dressing. °· Removing whatever was used to close your biopsy site. °· Check your biopsy site every day for signs of infection. Watch for: °· Redness, swelling, or pain. °· Fluid, blood, or pus. °GET HELP IF: °· You have a fever. °· You have redness, swelling, or pain at the biopsy site, and it lasts longer than a few days. °· You have fluid, blood, or pus coming from the biopsy site. °· You feel sick to your stomach (nauseous). °· You throw up (vomit). °GET HELP RIGHT AWAY IF: °· You are short of breath. °· You have trouble breathing. °· Your chest hurts. °· You feel dizzy or you pass out (faint). °· You have bleeding that does not stop with pressure or a bandage. °· You cough up blood. °· Your belly (abdomen) hurts. °  °This information is not intended to replace advice given to you by your health care provider. Make sure you discuss any questions you have with your health care provider. °  °Document Released: 03/11/2008 Document Revised: 08/13/2014 Document Reviewed: 03/25/2014 °Elsevier Interactive Patient Education ©2016 [employer] °Moderate Conscious Sedation, Adult °Sedation is the use of medicines to promote relaxation and relieve discomfort and anxiety. Moderate conscious sedation is a type of sedation. Under moderate conscious sedation you are less alert than normal but  are still able to respond to instructions or stimulation. Moderate conscious sedation is used during short medical and dental procedures. It is milder than deep sedation or general anesthesia and allows you to return to your regular activities sooner. °LET YOUR HEALTH CARE PROVIDER KNOW ABOUT:  °· Any allergies you have. °· All medicines you are taking, including vitamins, herbs, eye drops, creams, and over-the-counter medicines. °· Use of steroids (by mouth or creams). °· Previous problems you or members of your family have had with the use of anesthetics. °· Any blood disorders you have. °· Previous surgeries you have had. °· Medical conditions you have. °· Possibility of pregnancy, if this applies. °· Use of cigarettes, alcohol, or illegal drugs. °RISKS AND COMPLICATIONS °Generally, this is a safe procedure. However, as with any procedure, problems can occur. Possible problems include: °· Oversedation. °· Trouble breathing on your own. You may need to have a breathing tube until you are awake and breathing on your own. °· Allergic reaction to any of the medicines used for the procedure. °BEFORE THE PROCEDURE °· You may have blood tests done. These tests can help show how well your kidneys and liver are working. They can also show how well your blood clots. °· A physical exam will be done.   °· Only take medicines as directed by your health care provider. You may need to stop taking medicines (such as blood thinners, aspirin, or nonsteroidal anti-inflammatory drugs) before the procedure.   °· Do not eat or drink at least 6 hours before the procedure or as directed by   your health care provider. °· Arrange for a responsible adult, family member, or friend to take you home after the procedure. He or she should stay with you for at least 24 hours after the procedure, until the medicine has worn off. °PROCEDURE  °· An intravenous (IV) catheter will be inserted into one of your veins. Medicine will be able to flow  directly into your body through this catheter. You may be given medicine through this tube to help prevent pain and help you relax. °· The medical or dental procedure will be done. °AFTER THE PROCEDURE °· You will stay in a recovery area until the medicine has worn off. Your blood pressure and pulse will be checked.   °·  Depending on the procedure you had, you may be allowed to go home when you can tolerate liquids and your pain is under control. °  °This information is not intended to replace advice given to you by your health care provider. Make sure you discuss any questions you have with your health care provider. °  °Document Released: 12/22/2000 Document Revised: 04/19/2014 Document Reviewed: 12/04/2012 °Elsevier Interactive Patient Education ©2016 [employer] ° °Moderate Conscious Sedation, Adult, Care After °Refer to this sheet in the next few weeks. These instructions provide you with information on caring for yourself after your procedure. Your health care provider may also give you more specific instructions. Your treatment has been planned according to current medical practices, but problems sometimes occur. Call your health care provider if you have any problems or questions after your procedure. °WHAT TO EXPECT AFTER THE PROCEDURE  °After your procedure: °· You may feel sleepy, clumsy, and have poor balance for several hours. °· Vomiting may occur if you eat too soon after the procedure. °HOME CARE INSTRUCTIONS °· Do not participate in any activities where you could become injured for at least 24 hours. Do not: °¨ Drive. °¨ Swim. °¨ Ride a bicycle. °¨ Operate heavy machinery. °¨ Cook. °¨ Use power tools. °¨ Climb ladders. °¨ Work from a high place. °· Do not make important decisions or sign legal documents until you are improved. °· If you vomit, drink water, juice, or soup when you can drink without vomiting. Make sure you have little or no nausea before eating solid foods. °· Only take  over-the-counter or prescription medicines for pain, discomfort, or fever as directed by your health care provider. °· Make sure you and your family fully understand everything about the medicines given to you, including what side effects may occur. °· You should not drink alcohol, take sleeping pills, or take medicines that cause drowsiness for at least 24 hours. °· If you smoke, do not smoke without supervision. °· If you are feeling better, you may resume normal activities 24 hours after you were sedated. °· Keep all appointments with your health care provider. °SEEK MEDICAL CARE IF: °· Your skin is pale or bluish in color. °· You continue to feel nauseous or vomit. °· Your pain is getting worse and is not helped by medicine. °· You have bleeding or swelling. °· You are still sleepy or feeling clumsy after 24 hours. °SEEK IMMEDIATE MEDICAL CARE IF: °· You develop a rash. °· You have difficulty breathing. °· You develop any type of allergic problem. °· You have a fever. °MAKE SURE YOU: °· Understand these instructions. °· Will watch your condition. °· Will get help right away if you are not doing well or get worse. °  °This information is not intended to   replace advice given to you by your health care provider. Make sure you discuss any questions you have with your health care provider. °  °Document Released: 01/17/2013 Document Revised: 04/19/2014 Document Reviewed: 01/17/2013 °Elsevier Interactive Patient Education ©2016 [employer]. ° ° °

## 2015-07-16 ENCOUNTER — Encounter: Payer: Self-pay | Admitting: Internal Medicine

## 2015-07-16 ENCOUNTER — Ambulatory Visit (INDEPENDENT_AMBULATORY_CARE_PROVIDER_SITE_OTHER): Payer: Commercial Managed Care - PPO | Admitting: Internal Medicine

## 2015-07-16 ENCOUNTER — Other Ambulatory Visit (INDEPENDENT_AMBULATORY_CARE_PROVIDER_SITE_OTHER): Payer: Commercial Managed Care - PPO

## 2015-07-16 VITALS — BP 100/62 | HR 115 | Temp 98.7°F | Resp 20 | Ht 70.0 in | Wt 187.0 lb

## 2015-07-16 DIAGNOSIS — E785 Hyperlipidemia, unspecified: Secondary | ICD-10-CM

## 2015-07-16 DIAGNOSIS — Z23 Encounter for immunization: Secondary | ICD-10-CM | POA: Diagnosis not present

## 2015-07-16 DIAGNOSIS — C799 Secondary malignant neoplasm of unspecified site: Secondary | ICD-10-CM

## 2015-07-16 DIAGNOSIS — Z0000 Encounter for general adult medical examination without abnormal findings: Secondary | ICD-10-CM

## 2015-07-16 DIAGNOSIS — E118 Type 2 diabetes mellitus with unspecified complications: Secondary | ICD-10-CM

## 2015-07-16 DIAGNOSIS — E781 Pure hyperglyceridemia: Secondary | ICD-10-CM

## 2015-07-16 DIAGNOSIS — Z794 Long term (current) use of insulin: Secondary | ICD-10-CM

## 2015-07-16 DIAGNOSIS — R1084 Generalized abdominal pain: Secondary | ICD-10-CM

## 2015-07-16 DIAGNOSIS — E119 Type 2 diabetes mellitus without complications: Secondary | ICD-10-CM | POA: Insufficient documentation

## 2015-07-16 DIAGNOSIS — G893 Neoplasm related pain (acute) (chronic): Secondary | ICD-10-CM | POA: Diagnosis not present

## 2015-07-16 LAB — URINALYSIS, ROUTINE W REFLEX MICROSCOPIC
BILIRUBIN URINE: NEGATIVE
LEUKOCYTES UA: NEGATIVE
Nitrite: NEGATIVE
Specific Gravity, Urine: 1.015 (ref 1.000–1.030)
URINE GLUCOSE: NEGATIVE
UROBILINOGEN UA: 0.2 (ref 0.0–1.0)
pH: 7 (ref 5.0–8.0)

## 2015-07-16 LAB — TSH: TSH: 2.34 u[IU]/mL (ref 0.35–4.50)

## 2015-07-16 LAB — MICROALBUMIN / CREATININE URINE RATIO
Creatinine,U: 244 mg/dL
MICROALB UR: 2.9 mg/dL — AB (ref 0.0–1.9)
Microalb Creat Ratio: 1.2 mg/g (ref 0.0–30.0)

## 2015-07-16 LAB — LIPID PANEL
CHOL/HDL RATIO: 4
Cholesterol: 155 mg/dL (ref 0–200)
HDL: 44.1 mg/dL (ref >39.00)
LDL Cholesterol: 88 mg/dL (ref 0–99)
NONHDL: 110.72
TRIGLYCERIDES: 116 mg/dL (ref 0.0–149.0)
VLDL: 23.2 mg/dL (ref 0.0–40.0)

## 2015-07-16 LAB — HEMOGLOBIN A1C: Hgb A1c MFr Bld: 8.7 % — ABNORMAL HIGH (ref 4.6–6.5)

## 2015-07-16 MED ORDER — HYDROCODONE-ACETAMINOPHEN 5-325 MG PO TABS: 1.0000 | ORAL_TABLET | Freq: Four times a day (QID) | ORAL | Status: DC | PRN

## 2015-07-16 NOTE — Progress Notes (Signed)
Pre visit review using our clinic review tool, if applicable. No additional management support is needed unless otherwise documented below in the visit note. 

## 2015-07-16 NOTE — Progress Notes (Signed)
Subjective:  Patient ID: Jonathon Cline, male    DOB: [birth year]  Age: 55 y.o. MRN: [id number]  CC: Hyperlipidemia; Diabetes; and Annual Exam   HPI Jonathon Cline presents for a CPX - I have not seen him in about 5 years. He is currently in the process of being diagnosed with metastatic cancer. The cause is not yet known, he had a biopsy done one day prior to this visit - the results show metastatic adenocarcinoma. During the process of being evaluated he was found to have type 2 diabetes mellitus with an A1c of 10.2% about 3 weeks ago. He has been started on metformin and tells me that his blood sugars are coming down. His most recent numbers at home have been in the 120 to 130 range, these are random blood sugars. He is being treated for pain with hydrocodone.  History Jonathon Cline has a past medical history of Diabetes ([name]).   He has past surgical history that includes Spine surgery and Tonsillectomy.   His family history includes Cancer in his other; Cancer (age of onset: 85) in his paternal grandmother; Lung cancer in his father and mother. There is no history of Colon cancer, Esophageal cancer, or Stomach cancer.He reports that he quit smoking about 11 years ago. He has never used smokeless tobacco. He reports that he does not drink alcohol or use illicit drugs.  Outpatient Prescriptions Prior to Visit  Medication Sig Dispense Refill  . bisacodyl (DULCOLAX) 5 MG EC tablet Take 5 mg by mouth daily as needed for moderate constipation. Reported on 07/10/2015    . Docusate Sodium (DULCOLAX STOOL SOFTENER PO) Take by mouth as needed. Reported on 07/10/2015    . HYDROcodone Bitartrate (ZOHYDRO ER) 10 MG C12A Take 10 mg by mouth every 12 (twelve) hours. 20 each 0  . metFORMIN (GLUCOPHAGE) 500 MG tablet Take 1 tablet (500 mg total) by mouth 2 (two) times daily with a meal. 60 tablet 0  . polyethylene glycol powder (MIRALAX) powder Take 1 Container by mouth as needed.    . senna (SENOKOT) 8.6 MG TABS  tablet Take 1 tablet by mouth as needed for mild constipation.    Marland Kitchen HYDROcodone-acetaminophen (NORCO/VICODIN) 5-325 MG tablet Take 1 tablet by mouth every 6 (six) hours as needed for moderate pain. 60 tablet 0  . blood glucose meter kit and supplies KIT Dispense based on patient and insurance preference. Use up to four times daily as directed. E11.65 (Patient not taking: Reported on 07/16/2015) 1 each 0  . fentaNYL (DURAGESIC - DOSED MCG/HR) 25 MCG/HR patch Place 1 patch (25 mcg total) onto the skin every 3 (three) days. (Patient not taking: Reported on 07/10/2015) 5 patch 0   No facility-administered medications prior to visit.    ROS Review of Systems  Constitutional: Positive for activity change, appetite change, fatigue and unexpected weight change. Negative for chills and diaphoresis.  HENT: Negative.  Negative for sinus pressure, sore throat and trouble swallowing.   Eyes: Positive for visual disturbance. Negative for photophobia.  Respiratory: Positive for shortness of breath. Negative for apnea, cough, choking, chest tightness, wheezing and stridor.   Cardiovascular: Negative.  Negative for chest pain, palpitations and leg swelling.  Gastrointestinal: Positive for abdominal pain and abdominal distention. Negative for nausea, vomiting, diarrhea, constipation, blood in stool, anal bleeding and rectal pain.  Endocrine: Positive for polydipsia. Negative for polyphagia and polyuria.  Genitourinary: Positive for flank pain. Negative for dysuria, urgency, hematuria, decreased urine volume, difficulty urinating and  testicular pain.  Musculoskeletal: Positive for back pain. Negative for myalgias, joint swelling, arthralgias, gait problem and neck pain.  Skin: Negative.  Negative for color change, pallor, rash and wound.  Allergic/Immunologic: Negative.   Neurological: Negative.   Hematological: Negative.  Does not bruise/bleed easily.  Psychiatric/Behavioral: Negative.  Negative for sleep  disturbance, dysphoric mood and decreased concentration. The patient is not nervous/anxious.     Objective:  BP 100/62 mmHg  Pulse 115  Temp(Src) 98.7 F (37.1 C) (Oral)  Resp 20  Ht '5\' 10"'  (1.778 m)  Wt 187 lb (84.823 kg)  BMI 26.83 kg/m2  SpO2 96%  Physical Exam  Constitutional: He is oriented to person, place, and time. He appears well-developed and well-nourished. No distress.  HENT:  Head: Normocephalic and atraumatic.  Mouth/Throat: Oropharynx is clear and moist. No oropharyngeal exudate.  Eyes: Conjunctivae are normal. Right eye exhibits no discharge. Left eye exhibits no discharge. No scleral icterus.  Neck: Normal range of motion. Neck supple. No JVD present. No tracheal deviation present. No thyromegaly present.  Cardiovascular: Normal rate, regular rhythm and intact distal pulses.  Exam reveals no gallop and no friction rub.   No murmur heard. Pulmonary/Chest: Effort normal and breath sounds normal. No stridor. No respiratory distress. He has no wheezes. He has no rales. He exhibits no tenderness.  Abdominal: Soft. Bowel sounds are normal. He exhibits distension. He exhibits no shifting dullness, no pulsatile liver, no fluid wave, no abdominal bruit, no ascites, no pulsatile midline mass and no mass. There is no hepatosplenomegaly, splenomegaly or hepatomegaly. There is generalized tenderness. There is no rebound and no CVA tenderness. No hernia. Hernia confirmed negative in the ventral area, confirmed negative in the right inguinal area and confirmed negative in the left inguinal area.  Genitourinary: Rectum normal, prostate normal and penis normal. Rectal exam shows no external hemorrhoid, no internal hemorrhoid, no fissure, no mass, no tenderness and anal tone normal. Guaiac negative stool. Prostate is not enlarged and not tender. Right testis shows no mass, no swelling and no tenderness. Right testis is descended. Left testis shows no mass, no swelling and no tenderness. Left  testis is descended. Circumcised. No penile tenderness. No discharge found.  Right side spermatocele  Musculoskeletal: Normal range of motion. He exhibits no edema or tenderness.  Lymphadenopathy:    He has no cervical adenopathy.       Right: No inguinal adenopathy present.       Left: No inguinal adenopathy present.  Neurological: He is oriented to person, place, and time.  Skin: Skin is warm and dry. No rash noted. He is not diaphoretic. No erythema. No pallor.  Psychiatric: He has a normal mood and affect. His behavior is normal. Judgment and thought content normal.  Vitals reviewed.   Lab Results  Component Value Date   WBC 9.7 07/15/2015   HGB 13.9 07/15/2015   HCT 39.2 07/15/2015   PLT 406* 07/15/2015   GLUCOSE 169* 07/04/2015   CHOL 155 07/16/2015   TRIG 116.0 07/16/2015   HDL 44.10 07/16/2015   LDLDIRECT 165.7 07/28/2010   LDLCALC 88 07/16/2015   ALT 36 07/04/2015   AST 15 07/04/2015   NA 138 07/04/2015   K 4.4 07/04/2015   CL 98 07/04/2015   CREATININE 1.02 07/04/2015   BUN 11 07/04/2015   CO2 32 07/04/2015   TSH 2.34 07/16/2015   PSA 0.87 07/07/2015   INR 1.00 07/15/2015   HGBA1C 8.7* 07/16/2015   MICROALBUR 2.9* 07/16/2015  Assessment & Plan:   Alhassan was seen today for hyperlipidemia, diabetes and annual exam.  Diagnoses and all orders for this visit:  Type 2 diabetes mellitus with complication, with long-term current use of insulin ([place])- his A1c is down to 8.7%, he is in the midst of evaluation and treatment for metastatic adenocarcinoma so for now will tolerate a relatively uncontrolled type 2 diabetes mellitus and will pursue more aggressive therapy in the future if indicated. It appears his prognosis is very poor. -     Urinalysis, Routine w reflex microscopic (not at [facility]); Future -     Microalbumin / creatinine urine ratio; Future -     Amb Referral to Nutrition and Diabetic E -     Hemoglobin A1c; Future  Hyperlipidemia with target LDL less  than 100- he is not willing to start a statin at this time. -     Lipid panel; Future -     TSH; Future  Hypertriglyceridemia, essential- improvement noted -     Lipid panel; Future  Routine general medical examination at a health care facility- vaccines were reviewed and updated, exam completed, labs ordered and reviewed, his colonoscopy is up-to-date, recent PSA was normal, patient education material was given.  Need for Tdap vaccination -     Tdap vaccine greater than or equal to 7yo IM  Need for 23-polyvalent pneumococcal polysaccharide vaccine -     Pneumococcal polysaccharide vaccine 23-valent greater than or equal to 2yo subcutaneous/IM  Cancer associated pain -     HYDROcodone-acetaminophen (NORCO/VICODIN) 5-325 MG tablet; Take 1 tablet by mouth every 6 (six) hours as needed for moderate pain.  Generalized abdominal pain  Multiple lesions of metastatic malignancy (HCC) -     HYDROcodone-acetaminophen (NORCO/VICODIN) 5-325 MG tablet; Take 1 tablet by mouth every 6 (six) hours as needed for moderate pain.   I have discontinued Mr. Gillingham's fentaNYL. I am also having him maintain his metFORMIN, blood glucose meter kit and supplies, bisacodyl, Docusate Sodium (DULCOLAX STOOL SOFTENER PO), HYDROcodone Bitartrate, polyethylene glycol powder, senna, and HYDROcodone-acetaminophen.  Meds ordered this encounter  Medications  . HYDROcodone-acetaminophen (NORCO/VICODIN) 5-325 MG tablet    Sig: Take 1 tablet by mouth every 6 (six) hours as needed for moderate pain.    Dispense:  100 tablet    Refill:  0     Follow-up: Return in about 3 months (around 10/15/2015).  Scarlette Calico, MD

## 2015-07-16 NOTE — Patient Instructions (Signed)

## 2015-07-17 ENCOUNTER — Ambulatory Visit (HOSPITAL_BASED_OUTPATIENT_CLINIC_OR_DEPARTMENT_OTHER): Payer: Commercial Managed Care - PPO | Admitting: Hematology

## 2015-07-17 ENCOUNTER — Encounter: Payer: Self-pay | Admitting: Hematology

## 2015-07-17 ENCOUNTER — Ambulatory Visit: Payer: Commercial Managed Care - PPO | Admitting: Hematology

## 2015-07-17 ENCOUNTER — Telehealth: Payer: Self-pay | Admitting: Hematology

## 2015-07-17 VITALS — BP 127/74 | HR 116 | Temp 97.9°F | Resp 18 | Ht 70.0 in | Wt 186.6 lb

## 2015-07-17 DIAGNOSIS — R630 Anorexia: Secondary | ICD-10-CM

## 2015-07-17 DIAGNOSIS — R634 Abnormal weight loss: Secondary | ICD-10-CM

## 2015-07-17 DIAGNOSIS — C7800 Secondary malignant neoplasm of unspecified lung: Secondary | ICD-10-CM

## 2015-07-17 DIAGNOSIS — C786 Secondary malignant neoplasm of retroperitoneum and peritoneum: Secondary | ICD-10-CM

## 2015-07-17 DIAGNOSIS — R911 Solitary pulmonary nodule: Secondary | ICD-10-CM

## 2015-07-17 DIAGNOSIS — R109 Unspecified abdominal pain: Secondary | ICD-10-CM

## 2015-07-17 DIAGNOSIS — C801 Malignant (primary) neoplasm, unspecified: Secondary | ICD-10-CM | POA: Diagnosis not present

## 2015-07-17 DIAGNOSIS — C799 Secondary malignant neoplasm of unspecified site: Secondary | ICD-10-CM

## 2015-07-17 DIAGNOSIS — E119 Type 2 diabetes mellitus without complications: Secondary | ICD-10-CM

## 2015-07-17 MED ORDER — ONDANSETRON HCL 4 MG PO TABS: 4.0000 mg | ORAL_TABLET | Freq: Three times a day (TID) | ORAL | Status: AC | PRN

## 2015-07-17 MED ORDER — HYDROCODONE BITARTRATE 10 MG PO C12A: 10.0000 mg | EXTENDED_RELEASE_CAPSULE | Freq: Two times a day (BID) | ORAL | Status: DC

## 2015-07-17 NOTE — Progress Notes (Signed)
[place]  [phone] [phone]  Clinic follow Up Note   Patient Care Team: Janith Lima, MD as PCP - General (Internal Medicine) 07/17/2015   Referring physician: Dr. Ardis Hughs  CHIEF COMPLAINTS:  Follow-up of metastatic adenocarcinoma  HISTORY OF PRESENTING ILLNESS (07/10/2015):  [name] 55 y.o. male is here because of his abnormal CT scan findings, which is harvest spacers for metastatic malignancy. He is accompanied by his wife and sister to my clinic today.  He had an accident when he was moving a heavy stuff one month ago and he though he pulled his abdominal muscles. He has been having intermittent lower abdominal pain since then, it locates in his low abd , worse when he laugh, cough or after meals, it wakes him up at night sometimes. He also noticed mild chest pain when he coughs or lumps. No dyspnea or productive cough. He denies significant abdomen blating, no nausea or change of BM (except constipation from pain meds), he has lost aboiot 10-15 lbs in the past one month, appetite lower, stopped working one months due to the pain, he takes 2-6 vicodin a day. He was initially seen by urgent care, lab showed hyperglycemia, abnormal liver function. He was referred to gastroenterologist Dr. Ardis Hughs. CT of abdomen and pelvis with contrast was obtained on 07/04/2015, which showed peritoneal carcinomatosis, lytic bone lesions in T9 and T11, no definitive primary malignancy. He underwent EGD and colonoscopy yesterday by Dr. Ardis Hughs, no primary tumor was found.  He has not see her PCP for 5 years, and has scheduled to see PCP next week.   CURRENT THERAPY: pending radiation and systemic therapy   INTERIM HISTORY: Copper returns for follow-up. He is accompanied by his wife, sister and brother-in-law. His main complaint is still abdominal pain, which is better controlled with zohydro and vocidon. He complains of abdominal bloating, and mild nausea, no  vomiting. He is also constipation from pain medication. His appetite and energy level are fair, is able to function well at home. No other new complaints.  MEDICAL HISTORY:  Past Medical History  Diagnosis Date  . Diabetes [facility])     SURGICAL HISTORY: Past Surgical History  Procedure Laterality Date  . Spine surgery      cervical fusion  . Tonsillectomy      SOCIAL HISTORY: Social History   Social History  . Marital Status: Married    Spouse Name: N/A  . Number of Children: N/A  . Years of Education: N/A   Occupational History  . Not on file.   Social History Main Topics  . Smoking status: Former Smoker -- 1.00 packs/day for 20 years    Quit date: 04/12/2004  . Smokeless tobacco: Never Used  . Alcohol Use: No     Comment: social drinker   . Drug Use: No  . Sexual Activity: Not on file   Other Topics Concern  . 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 Cancer Other     Lung  . Lung cancer Mother   . Lung cancer Father   .  Colon cancer Neg Hx   . Esophageal cancer Neg Hx   . Stomach cancer Neg Hx   . Cancer Paternal Grandmother 38    ovarian cancer     ALLERGIES:  has No Known Allergies.  MEDICATIONS:  Current Outpatient Prescriptions  Medication Sig Dispense Refill  . bisacodyl (DULCOLAX) 5 MG EC tablet Take 5 mg by mouth daily as needed for moderate constipation. Reported on 07/10/2015    . blood glucose meter kit and supplies KIT Dispense based on patient and insurance preference. Use up to four times daily as directed. E11.65 1 each 0  . Docusate Sodium (DULCOLAX STOOL SOFTENER PO) Take by mouth as needed. Reported on 07/10/2015    .  HYDROcodone Bitartrate (ZOHYDRO ER) 10 MG C12A Take 10 mg by mouth every 12 (twelve) hours. 60 each 0  . HYDROcodone-acetaminophen (NORCO/VICODIN) 5-325 MG tablet Take 1 tablet by mouth every 6 (six) hours as needed for moderate pain. 100 tablet 0  . metFORMIN (GLUCOPHAGE) 500 MG tablet Take 1 tablet (500 mg total) by mouth 2 (two) times daily with a meal. 60 tablet 0  . polyethylene glycol powder (MIRALAX) powder Take 1 Container by mouth as needed.    . senna (SENOKOT) 8.6 MG TABS tablet Take 1 tablet by mouth as needed for mild constipation.    . ondansetron (ZOFRAN) 4 MG tablet Take 1 tablet (4 mg total) by mouth every 8 (eight) hours as needed for nausea or vomiting. 30 tablet 1   No current facility-administered medications for this visit.    REVIEW OF SYSTEMS:   Constitutional: Denies fevers, chills or abnormal night sweats Eyes: Denies blurriness of vision, double vision or watery eyes Ears, nose, mouth, throat, and face: Denies mucositis or sore throat Respiratory: Denies cough, dyspnea or wheezes Cardiovascular: Denies palpitation, chest discomfort or lower extremity swelling Gastrointestinal:  Denies nausea, heartburn or change in bowel habits Skin: Denies abnormal skin rashes Lymphatics: Denies new lymphadenopathy or easy bruising Neurological:Denies numbness, tingling or new weaknesses Behavioral/Psych: Mood is stable, no new changes  All other systems were reviewed with the patient and are negative.  PHYSICAL EXAMINATION: ECOG PERFORMANCE STATUS: 1 - Symptomatic but completely ambulatory  Filed Vitals:   07/17/15 1522  BP: 127/74  Pulse: 116  Temp: 97.9 F (36.6 C)  Resp: 18   Filed Weights   07/17/15 1522  Weight: 186 lb 9.6 oz (84.641 kg)    GENERAL:alert, no distress and comfortable SKIN: skin color, texture, turgor are normal, no rashes or significant lesions EYES: normal, conjunctiva are pink and non-injected, sclera clear OROPHARYNX:no exudate, no  erythema and lips, buccal mucosa, and tongue normal  NECK: supple, thyroid normal size, non-tender, without nodularity LYMPH:  no palpable lymphadenopathy in the cervical, axillary or inguinal LUNGS: clear to auscultation and percussion with normal breathing effort HEART: regular rate & rhythm and no murmurs and no lower extremity edema ABDOMEN:abdomen soft, Slightly bloated, diffuse tenderness in his lower abdomen, no organomegaly , no palpable mass, normal bowel sounds Musculoskeletal:no cyanosis of digits and no clubbing  PSYCH: alert & oriented x 3 with fluent speech NEURO: no focal motor/sensory deficits  LABORATORY DATA:  I have reviewed the data as listed CBC Latest Ref Rng 07/15/2015 07/04/2015 06/23/2015  WBC 4.0 - 10.5 K/uL 9.7 10.0 9.0  Hemoglobin 13.0 - 17.0 g/dL 13.9 15.4 16.5  Hematocrit 39.0 - 52.0 % 39.2 45.0 46.0  Platelets 150 - 400 K/uL 406(H) 389.0 -    CMP Latest Ref Rng  07/04/2015 06/23/2015 07/28/2010  Glucose 70 - 99 mg/dL 169(H) 326(H) 131(H)  BUN 6 - 23 mg/dL '11 15 11  ' Creatinine 0.40 - 1.50 mg/dL 1.02 0.84 0.9  Sodium 135 - 145 mEq/L 138 137 141  Potassium 3.5 - 5.1 mEq/L 4.4 4.1 4.3  Chloride 96 - 112 mEq/L 98 97(L) 107  CO2 19 - 32 mEq/L 32 32(H) 28  Calcium 8.4 - 10.5 mg/dL 10.0 9.8 9.2  Total Protein 6.0 - 8.3 g/dL 7.5 7.5 6.6  Total Bilirubin 0.2 - 1.2 mg/dL 0.8 0.8 0.6  Alkaline Phos 39 - 117 U/L 97 129(H) 72  AST 0 - 37 U/L 15 45(H) 24  ALT 0 - 53 U/L 36 68(H) 47   Results for KAYO, ZION (MRN [id number]) as of 07/10/2015 08:31  Ref. Range 07/07/2015 08:51  CA 19-9 Latest Ref Range: <34 U/mL 271 (H)  CEA Latest Units: ng/mL 147.3 (H)  PSA Latest Ref Range: 0.10-4.00 ng/mL 0.87   PATHOLOGY REPORT  Diagnosis 07/15/2015 Omentum, biopsy, RUQ CT - METASTATIC ADENOCARCINOMA. - SEE MICROSCOPIC DESCRIPTION. Microscopic Comment There is adipose tissue which is involved by adenocarcinoma with focal signet ring features. Immunohistochemistry will be  performed and reported as an addendum.  RADIOGRAPHIC STUDIES: I have personally reviewed the radiological images as listed and agreed with the findings in the report.  Ct Abdomen Pelvis W Contrast 07/04/2015   IMPRESSION:  1. Findings of peritoneal carcinomatosis, including omental caking, mesenteric nodularity, nodular peritoneal thickening in the bilateral paracolic gutters, thickening and enhancement of the pelvic peritoneum and trace pelvic ascites.  2. Lytic osseous lesions in the T9 and T11 vertebral bodies worrisome for osseous metastases. The T11 osseous lesion appears to minimally efface the thoracic spinal canal anteriorly at this level, without CT evidence of thoracic cord compression. Correlate with thoracic MRI as clinically warranted.  3. A definite primary malignancy is not evident. Nonspecific mild circumferential wall thickening in the lower thoracic esophagus, and questionable wall thickening in the gastric antrum, cannot exclude neoplasm in these locations. Consider further evaluation with upper and lower endoscopy and PET-CT.  4. Two 3 mm pulmonary nodules in the right lower lobe, indeterminate.  5. Additional findings include coronary atherosclerosis, diffuse hepatic steatosis, cholelithiasis and mild prostatomegaly. These results will be called to the ordering clinician or representative by the Radiologist Assistant, and communication documented in the PACS or zVision Dashboard. Electronically Signed   By: Ilona Sorrel M.D.   On: 07/04/2015 17:11   CT chest w contrast 07/10/2015 IMPRESSION: 9 mm spiculated pulmonary nodule in left upper lobe, with morphology suspicious for a primary bronchogenic carcinoma. 6 mm mediastinal lymph node in the lateral aortic region noted, but not considered pathologically enlarged. Consider PET-CT for further evaluation.  Indeterminate sub-cm right lung nodules, largest measuring 5 mm. Continued attention recommended on follow-up  imaging.  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  Findings of upper abdominal peritoneal carcinomatosis again noted.  PET 07/11/2015 IMPRESSION: 1. Examination is positive for multifocal hypermetabolic lytic bone metastases. Lesions are identified within the extremities, spine and pelvis. Most concerning is a lytic lesion involving the posterior aspect of the T11 vertebra. If there is a concern for canal involvement then an MRI with contrast material may be helpful for further assessment. 2. Hypermetabolic spiculated nodule within the left upper lobe is identified. This may represent a primary pulmonary malignancy or a focus of metastatic disease. 3. Evidence of peritoneal carcinomatosis with presumed malignant ascites and omental caking.  EGD AND COLONOSCOPY 07/09/2015 Impression: A Grade A esophagitis. Biopsied. - Small hiatal hernia. - The examination was otherwise normal. - One 9 mm polyp in the transverse colon, removed with a cold snare. Resected and retrieved. - The examination was otherwise normal on direct and retroflexion views.   ASSESSMENT & PLAN:  55 year old Caucasian male, without significant past medical history, former smoker with 20 pack years smoking history, presented with abdominal pain, anorexia, weight loss.   1. 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 -His CT and PET scan findings are highly suspicious for metastatic malignancy. There is no convincing primary tumor. His EGD and colonoscop was negative for primary tumor. -The 9 mm left upper lung nodule is very suspicious for a primary lung cancer, but not very typical as the primary tumor which causes the diffuse metastatic disease due to its small size. It also could be a metastatic lesion.  -His  tumor marker showed significant elevated CEA and CA 19.9, which supports for a upper GI primary. -The omental biopsy confirmed metastatic adenocarcinoma, however the IHC studies were not conclusive. CK 7 positive supporting primary lung or upper GI, however the other lung or GI marker were negative. -I recommend a abdominal MRI to look into liver and pancreas closely to ruled out occlude primary. He has no anemia, makes small bowel primary less likely. -If the abdominal MRI is negative for primary lesion, I think this is most likely a metastatic lung adenocarcinoma. -I have requested the biopsy sample to be sent for Foundation one genomic testing, and the PD-L1 IHC, to see if is a candidate for targeted therapy or immunotherapy. -Repeat in placement issue of the Foundation one testing was discussed with patient. He agrees to proceed -If PD-L1 >50% positive, I would recommend first-line Keytruda, which has been approved for metastatic lung cancer. If his tumor contains EGFR mutation, ALK or ROS-1 translocation, then I would recommend targeted therapy. -We'll tentatively if he does not have the above positive test result, I would recommend systemic chemotherapy, likely carboplatin and paclitaxel. -We reviewed that this is incurable disease, and his overall prognosis is very poor. We discussed the mediate survival For metastatic lung cancer, and the benefit of systemic therapy.  - I'll also obtain a brain MRI to rule out CNS metastasis.   2. Abdominal pain -continue vicodin and zohydro   3. 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 Lisbeth Renshaw, and is planning to have PET if radiation to the T11 lesion.  - if he responds to systemic therapy, I'll consider Xgeva for his bone metastasis.   3. Anorexia and weight loss -I encouraged him to try nutritional supplement, such as  Glucerna   -NUTRITION consult  4. Newly diagnosed diabetes -He  will follow-up with his primary care physician, he has recently started metformin -I encouraged him to monitor his blood work was at home  Plan for today -brian and abdominal MRI with and without contrast  -Foundation one and PD-L1 IHC on his biopsy sample was requested today, I spoke with the pathologist Dr. Saralyn Pilar  -He will start spinal radiation next week  -I will tentatively start him on systemic therapy in 2-3 weeks   All questions were answered. The patient knows to call the clinic with any problems, questions or concerns.  I spent 35 minutes counseling the patient face to face. The total time spent in the appointment was 40 minutes and more than 50% was on counseling.     Truitt Merle, MD 07/17/2015 9:34 PM

## 2015-07-17 NOTE — Telephone Encounter (Signed)
per of to sch pt appt-gave pt copy of avs °

## 2015-07-22 ENCOUNTER — Other Ambulatory Visit: Payer: Self-pay | Admitting: *Deleted

## 2015-07-22 ENCOUNTER — Telehealth: Payer: Self-pay | Admitting: *Deleted

## 2015-07-22 ENCOUNTER — Other Ambulatory Visit: Payer: Self-pay | Admitting: Hematology

## 2015-07-22 DIAGNOSIS — C799 Secondary malignant neoplasm of unspecified site: Secondary | ICD-10-CM

## 2015-07-22 MED ORDER — OXYCODONE HCL ER 10 MG PO T12A: 10.0000 mg | EXTENDED_RELEASE_TABLET | Freq: Two times a day (BID) | ORAL | Status: DC

## 2015-07-22 MED ORDER — HYDROCODONE BITARTRATE 10 MG PO C12A: 10.0000 mg | EXTENDED_RELEASE_CAPSULE | Freq: Two times a day (BID) | ORAL | Status: DC

## 2015-07-22 NOTE — Telephone Encounter (Signed)
  Oncology Nurse Navigator Documentation  Navigator Location: [facility] (07/22/15 1537) Navigator Encounter Type: Telephone (07/22/15 1537) Telephone: Incoming Call;Outgoing Call;Medication Assistance (07/22/15 1537)  Wife called to report [facility] is not able to order the full bottle of Zohdyro ER 10mg . Was told there is a Producer, television/film/video and difficult to obtain. They are requesting a different long acting pain med that will be easier to obtain. Informed wife that MD will be notified of the [place] and confirmed with the buyer that there is a shortage of this drug now. If they have the script they can order it, but there are only 11 bottles on hand at the supplier.

## 2015-07-23 ENCOUNTER — Ambulatory Visit
Admission: RE | Admit: 2015-07-23 | Discharge: 2015-07-23 | Disposition: A | Discharge status: Home / Self Care | Payer: Commercial Managed Care - PPO | Source: Ambulatory Visit | Attending: Radiation Oncology | Admitting: Radiation Oncology

## 2015-07-23 DIAGNOSIS — Z510 Encounter for antineoplastic radiation therapy: Secondary | ICD-10-CM | POA: Diagnosis not present

## 2015-07-23 DIAGNOSIS — C7951 Secondary malignant neoplasm of bone: Secondary | ICD-10-CM

## 2015-07-25 NOTE — Progress Notes (Signed)
  Radiation Oncology         [phone] ________________________________  Name: Jonathon Cline MRN: [id number]  Date: 07/23/2015  DOB: Jan 31, 1961  Optical Surface Tracking Plan:  Since intensity modulated radiotherapy (IMRT) and 3D conformal radiation treatment methods are predicated on accurate and precise positioning for treatment, intrafraction motion monitoring is medically necessary to ensure accurate and safe treatment delivery.  The ability to quantify intrafraction motion without excessive ionizing radiation dose can only be performed with optical surface tracking. Accordingly, surface imaging offers the opportunity to obtain 3D measurements of patient position throughout IMRT and 3D treatments without excessive radiation exposure.  I am ordering optical surface tracking for this patient's upcoming course of radiotherapy. ________________________________  Kyung Rudd, MD 07/25/2015 7:35 AM    Reference:   Ursula Alert, J, et al. Surface imaging-based analysis of intrafraction motion for breast radiotherapy patients.Journal of [place], n. 6, nov. 2014. ISSN [id number].   Available at: <http://www.jacmp.org/index.php/jacmp/article/view/4957>.

## 2015-07-25 NOTE — Progress Notes (Addendum)
  Radiation Oncology         [phone] ________________________________  Name: Jonathon Cline MRN: [id number]  Date: 07/23/2015  DOB: 11/09/1960  SIMULATION AND TREATMENT PLANNING NOTE  DIAGNOSIS:     ICD-9-CM ICD-10-CM   1. 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  Left second rib 2.  T8-T12 spine 3.  Right hip  NARRATIVE:  The patient was brought to the [place] [facility].  Identity was confirmed.  All relevant records and images related to the planned course of therapy were reviewed.   Written consent to proceed with treatment was confirmed which was freely given after reviewing the details related to the planned course of therapy had been reviewed with the patient.  Then, the patient was set-up in a stable reproducible  supine position for radiation therapy.  CT images were obtained.  Surface markings were placed.    Medically necessary complex treatment device(s) for immobilization:  Customized vac lock bag.   The CT images were loaded into the planning software.  Then the target and avoidance structures were contoured.  Treatment planning then occurred.  The radiation prescription was entered and confirmed.  A total of 6 complex treatment devices were fabricated which relate to the designed radiation treatment fields: 2 radiation treatment fields for each of the 3 separate target sites listed above  . Each of these customized fields/ complex treatment devices will be used on a daily basis during the radiation course. I have requested : 3D Simulation  I have requested a DVH of the following structures: Target volume, spinal cord, heart.   PLAN:  The patient will receive 37.5Gy in 15 fractions.    Special treatment procedure The patient will also receive concurrent chemotherapy during the treatment. The patient may therefore experience increased toxicity or side effects and the patient will be monitored for such problems. This may require extra lab work as  necessary. This therefore constitutes a special treatment procedure.  ________________________________   Jodelle Gross, MD, PhD

## 2015-07-25 NOTE — Addendum Note (Signed)
Encounter addended by: Kyung Rudd, MD on: 07/25/2015  7:35 AM<BR>     Documentation filed: Notes Section

## 2015-07-28 ENCOUNTER — Telehealth: Payer: Self-pay | Admitting: *Deleted

## 2015-07-28 DIAGNOSIS — Z510 Encounter for antineoplastic radiation therapy: Secondary | ICD-10-CM | POA: Diagnosis not present

## 2015-07-28 NOTE — Telephone Encounter (Signed)
Received call from wife Georgina Peer requesting refill of Vicodin.  Spoke with Jan and was informed that pt was given  # 100 of Vicodin 5/325 by his PCP Dr. Scarlette Calico on 07/16/15.   Instructions were - take 1 tablet every 6 hours as needed for moderate pain ;  However,  Jan stated pt has been taken  Vicodin every 3 -4 hours, along with Oxycontin 10 mg every 12 hours.  Per Jan, pt tolerates Oxycontin better. Pt is almost out of Vicodin - has only 5 - 6 pills left.   Pt needs refill.  Per Jan, pt's bowel has not been moving as it should.  Pt also has decreased appetite, but drinking enough fluids.   Informed Jan that message will be relayed to Dr. Burr Medico.   Jan understood that Dr. Ernestina Penna nurse will contact her with further instructions from md in the am. Jan's   Phone     [phone].

## 2015-07-29 ENCOUNTER — Ambulatory Visit (HOSPITAL_COMMUNITY)
Admission: RE | Admit: 2015-07-29 | Discharge: 2015-07-29 | Disposition: A | Discharge status: Home / Self Care | Payer: Commercial Managed Care - PPO | Source: Ambulatory Visit | Attending: Hematology | Admitting: Hematology

## 2015-07-29 ENCOUNTER — Other Ambulatory Visit: Payer: Self-pay | Admitting: *Deleted

## 2015-07-29 ENCOUNTER — Other Ambulatory Visit: Payer: Self-pay | Admitting: Hematology

## 2015-07-29 ENCOUNTER — Ambulatory Visit (HOSPITAL_COMMUNITY): Admission: RE | Admit: 2015-07-29 | Payer: Commercial Managed Care - PPO | Source: Ambulatory Visit

## 2015-07-29 DIAGNOSIS — K760 Fatty (change of) liver, not elsewhere classified: Secondary | ICD-10-CM | POA: Insufficient documentation

## 2015-07-29 DIAGNOSIS — G893 Neoplasm related pain (acute) (chronic): Secondary | ICD-10-CM

## 2015-07-29 DIAGNOSIS — K8020 Calculus of gallbladder without cholecystitis without obstruction: Secondary | ICD-10-CM | POA: Insufficient documentation

## 2015-07-29 DIAGNOSIS — C799 Secondary malignant neoplasm of unspecified site: Secondary | ICD-10-CM

## 2015-07-29 DIAGNOSIS — R188 Other ascites: Secondary | ICD-10-CM | POA: Diagnosis not present

## 2015-07-29 DIAGNOSIS — C786 Secondary malignant neoplasm of retroperitoneum and peritoneum: Secondary | ICD-10-CM | POA: Insufficient documentation

## 2015-07-29 DIAGNOSIS — C801 Malignant (primary) neoplasm, unspecified: Secondary | ICD-10-CM | POA: Insufficient documentation

## 2015-07-29 DIAGNOSIS — C7951 Secondary malignant neoplasm of bone: Secondary | ICD-10-CM | POA: Diagnosis not present

## 2015-07-29 MED ORDER — HYDROCODONE-ACETAMINOPHEN 5-325 MG PO TABS: 1.0000 | ORAL_TABLET | Freq: Four times a day (QID) | ORAL | Status: DC | PRN

## 2015-07-29 MED ORDER — GADOBENATE DIMEGLUMINE 529 MG/ML IV SOLN
20.0000 mL | Freq: Once | INTRAVENOUS | Status: AC | PRN
  Administered 2015-07-29: 17 mL via INTRAVENOUS

## 2015-07-29 MED ORDER — OXYCODONE HCL ER 15 MG PO T12A: 15.0000 mg | EXTENDED_RELEASE_TABLET | Freq: Two times a day (BID) | ORAL | Status: DC

## 2015-07-29 NOTE — Telephone Encounter (Signed)
Script for 15 mg oxycontin given plus refill on vicodin per Dr Burr Medico.  Wife picked up & instructed to take stool softener bid with oxycontin & may need to adjust as needed.  Pt had BM this am.

## 2015-07-30 ENCOUNTER — Ambulatory Visit
Admission: RE | Admit: 2015-07-30 | Discharge: 2015-07-30 | Disposition: A | Discharge status: Home / Self Care | Payer: Commercial Managed Care - PPO | Source: Ambulatory Visit | Attending: Radiation Oncology | Admitting: Radiation Oncology

## 2015-07-30 ENCOUNTER — Encounter (HOSPITAL_COMMUNITY): Payer: Self-pay

## 2015-07-30 ENCOUNTER — Ambulatory Visit: Payer: Commercial Managed Care - PPO

## 2015-07-30 DIAGNOSIS — Z510 Encounter for antineoplastic radiation therapy: Secondary | ICD-10-CM | POA: Diagnosis not present

## 2015-07-31 ENCOUNTER — Other Ambulatory Visit (HOSPITAL_BASED_OUTPATIENT_CLINIC_OR_DEPARTMENT_OTHER): Payer: Commercial Managed Care - PPO

## 2015-07-31 ENCOUNTER — Other Ambulatory Visit: Payer: Self-pay | Admitting: Hematology

## 2015-07-31 ENCOUNTER — Ambulatory Visit (HOSPITAL_BASED_OUTPATIENT_CLINIC_OR_DEPARTMENT_OTHER): Payer: Commercial Managed Care - PPO | Admitting: Hematology

## 2015-07-31 ENCOUNTER — Ambulatory Visit
Admission: RE | Admit: 2015-07-31 | Discharge: 2015-07-31 | Disposition: A | Discharge status: Home / Self Care | Payer: Commercial Managed Care - PPO | Source: Ambulatory Visit | Attending: Radiation Oncology | Admitting: Radiation Oncology

## 2015-07-31 ENCOUNTER — Encounter: Payer: Self-pay | Admitting: Hematology

## 2015-07-31 ENCOUNTER — Encounter: Payer: Commercial Managed Care - PPO | Admitting: Nutrition

## 2015-07-31 ENCOUNTER — Ambulatory Visit: Payer: Commercial Managed Care - PPO

## 2015-07-31 ENCOUNTER — Other Ambulatory Visit: Payer: Self-pay | Admitting: *Deleted

## 2015-07-31 VITALS — BP 112/76 | HR 107 | Temp 97.9°F | Resp 18 | Ht 70.0 in | Wt 181.5 lb

## 2015-07-31 DIAGNOSIS — C801 Malignant (primary) neoplasm, unspecified: Secondary | ICD-10-CM | POA: Diagnosis not present

## 2015-07-31 DIAGNOSIS — C799 Secondary malignant neoplasm of unspecified site: Secondary | ICD-10-CM

## 2015-07-31 DIAGNOSIS — Z510 Encounter for antineoplastic radiation therapy: Secondary | ICD-10-CM | POA: Diagnosis not present

## 2015-07-31 DIAGNOSIS — C7800 Secondary malignant neoplasm of unspecified lung: Secondary | ICD-10-CM | POA: Diagnosis not present

## 2015-07-31 DIAGNOSIS — R634 Abnormal weight loss: Secondary | ICD-10-CM

## 2015-07-31 DIAGNOSIS — R140 Abdominal distension (gaseous): Secondary | ICD-10-CM

## 2015-07-31 DIAGNOSIS — C786 Secondary malignant neoplasm of retroperitoneum and peritoneum: Secondary | ICD-10-CM

## 2015-07-31 DIAGNOSIS — C7951 Secondary malignant neoplasm of bone: Secondary | ICD-10-CM

## 2015-07-31 DIAGNOSIS — R109 Unspecified abdominal pain: Secondary | ICD-10-CM

## 2015-07-31 DIAGNOSIS — R630 Anorexia: Secondary | ICD-10-CM

## 2015-07-31 DIAGNOSIS — E119 Type 2 diabetes mellitus without complications: Secondary | ICD-10-CM

## 2015-07-31 LAB — COMPREHENSIVE METABOLIC PANEL
ALBUMIN: 2.9 g/dL — AB (ref 3.5–5.0)
ALT: 11 U/L (ref 0–55)
AST: 16 U/L (ref 5–34)
Alkaline Phosphatase: 91 U/L (ref 40–150)
Anion Gap: 13 mEq/L — ABNORMAL HIGH (ref 3–11)
BUN: 14.3 mg/dL (ref 7.0–26.0)
CHLORIDE: 99 meq/L (ref 98–109)
CO2: 28 meq/L (ref 22–29)
Calcium: 9.9 mg/dL (ref 8.4–10.4)
Creatinine: 0.9 mg/dL (ref 0.7–1.3)
EGFR: >90 mL/min/{1.73_m2} (ref >90)
GLUCOSE: 128 mg/dL (ref 70–140)
POTASSIUM: 4.8 meq/L (ref 3.5–5.1)
SODIUM: 140 meq/L (ref 136–145)
Total Bilirubin: 0.51 mg/dL (ref 0.20–1.20)
Total Protein: 7.2 g/dL (ref 6.4–8.3)

## 2015-07-31 LAB — CBC WITH DIFFERENTIAL/PLATELET
BASO%: 0.1 % (ref 0.0–2.0)
BASOS ABS: 0 10*3/uL (ref 0.0–0.1)
EOS%: 0.4 % (ref 0.0–7.0)
Eosinophils Absolute: 0 10*3/uL (ref 0.0–0.5)
HCT: 36.4 % — ABNORMAL LOW (ref 38.4–49.9)
HEMOGLOBIN: 12.1 g/dL — AB (ref 13.0–17.1)
LYMPH%: 15.2 % (ref 14.0–49.0)
MCH: 27.9 pg (ref 27.2–33.4)
MCHC: 33.2 g/dL (ref 32.0–36.0)
MCV: 83.9 fL (ref 79.3–98.0)
MONO#: 1.1 10*3/uL — ABNORMAL HIGH (ref 0.1–0.9)
MONO%: 12.2 % (ref 0.0–14.0)
NEUT#: 6.5 10*3/uL (ref 1.5–6.5)
NEUT%: 72.1 % (ref 39.0–75.0)
Platelets: 512 10*3/uL — ABNORMAL HIGH (ref 140–400)
RBC: 4.34 10*6/uL (ref 4.20–5.82)
RDW: 12 % (ref 11.0–14.6)
WBC: 9 10*3/uL (ref 4.0–10.3)
lymph#: 1.4 10*3/uL (ref 0.9–3.3)

## 2015-07-31 MED ORDER — CYANOCOBALAMIN 1000 MCG/ML IJ SOLN
INTRAMUSCULAR | Status: AC
  Filled 2015-07-31: qty 1

## 2015-07-31 MED ORDER — OXYCODONE HCL 5 MG PO TABS: 5.0000 mg | ORAL_TABLET | ORAL | Status: DC | PRN

## 2015-07-31 MED ORDER — CYANOCOBALAMIN 1000 MCG/ML IJ SOLN
1000.0000 ug | Freq: Once | INTRAMUSCULAR | Status: AC
  Administered 2015-07-31: 1000 ug via INTRAMUSCULAR

## 2015-07-31 NOTE — Progress Notes (Signed)
[place]  Telephone:([phone] [phone]  Clinic follow Up Note   Patient Care Team: Janith Lima, MD as PCP - General (Internal Medicine) Milus Banister, MD as Attending Physician (Gastroenterology) Kyung Rudd, MD as Consulting Physician (Radiation Oncology) 07/31/2015    CHIEF COMPLAINTS:  Follow-up of metastatic adenocarcinoma, probable lung primary  HISTORY OF PRESENTING ILLNESS (07/10/2015):  Jonathon Cline 55 y.o. male is here because of his abnormal CT scan findings, which is harvest spacers for metastatic malignancy. He is accompanied by his wife and sister to my clinic today.  He had an accident when he was moving a heavy stuff one month ago and he though he pulled his abdominal muscles. He has been having intermittent lower abdominal pain since then, it locates in his low abd , worse when he laugh, cough or after meals, it wakes him up at night sometimes. He also noticed mild chest pain when he coughs or lumps. No dyspnea or productive cough. He denies significant abdomen blating, no nausea or change of BM (except constipation from pain meds), he has lost aboiot 10-15 lbs in the past one month, appetite lower, stopped working one months due to the pain, he takes 2-6 vicodin a day. He was initially seen by urgent care, lab showed hyperglycemia, abnormal liver function. He was referred to gastroenterologist Dr. Ardis Hughs. CT of abdomen and pelvis with contrast was obtained on 07/04/2015, which showed peritoneal carcinomatosis, lytic bone lesions in T9 and T11, no definitive primary malignancy. He underwent EGD and colonoscopy yesterday by Dr. Ardis Hughs, no primary tumor was found.  He has not see her PCP for 5 years, and has scheduled to see PCP next week.   CURRENT THERAPY:  1. He started palliative radiation 07/31/2015 2. Pending systemic therapy, likely chemo carboplatin and Alimta starting on April 28  INTERIM HISTORY: Jonathon Cline returns for follow-up. He  is accompanied by his wife, sister and daughter. His doing well overall. He still has moderate abdominal pain, especially he is active. He takes OxyContin 10 mg twice daily, and Vicodin 2-5 pills a day. He has moderate abdominal bloating, no significant dyspnea. He is constipated, he has been using magnesium citrate. He has lost about 5-6 pounds in the past 2 weeks, no other new complaints.  MEDICAL HISTORY:  Past Medical History  Diagnosis Date  . Diabetes [facility])     SURGICAL HISTORY: Past Surgical History  Procedure Laterality Date  . Spine surgery      cervical fusion  . Tonsillectomy      SOCIAL HISTORY: Social History   Social History  . Marital Status: Married    Spouse Name: N/A  . Number of Children: N/A  . Years of Education: N/A   Occupational History  . Not on file.   Social History Main Topics  . Smoking status: Former Smoker -- 1.00 packs/day for 20 years    Quit date: 04/12/2004  . Smokeless tobacco: Never Used  . Alcohol Use: No     Comment: social drinker   . Drug Use: No  . Sexual Activity: Not on file   Other Topics Concern  . Not on file   Social History Narrative   Married, wfe Thayer Headings   Works at [employer] level of education- College   Caffienated beverages-Yes   Seats belts often-Yes   Regular exercise-Yes   Firearms in the [employer]   Physical abuse-NO   Smoke alarm in [employer]  FAMILY HISTORY: Family History  Problem Relation Age of Onset  . Cancer Other     Lung  . Lung cancer Mother   . Lung cancer Father   . Colon cancer Neg Hx   . Esophageal cancer Neg Hx   . Stomach cancer Neg Hx   . Cancer Paternal Grandmother 51    ovarian cancer     ALLERGIES:  has No Known Allergies.  MEDICATIONS:  Current Outpatient Prescriptions  Medication Sig Dispense Refill  . bisacodyl (DULCOLAX) 5 MG EC tablet Take 5 mg by mouth daily as needed for moderate constipation. Reported on 07/31/2015    . blood glucose  meter kit and supplies KIT Dispense based on patient and insurance preference. Use up to four times daily as directed. E11.65 1 each 0  . HYDROcodone-acetaminophen (NORCO/VICODIN) 5-325 MG tablet Take 1 tablet by mouth every 6 (six) hours as needed for moderate pain. 90 tablet 0  . magnesium citrate SOLN Take 1 Bottle by mouth daily as needed for severe constipation.    . metFORMIN (GLUCOPHAGE) 500 MG tablet Take 1 tablet (500 mg total) by mouth 2 (two) times daily with a meal. 60 tablet 0  . ondansetron (ZOFRAN) 4 MG tablet Take 1 tablet (4 mg total) by mouth every 8 (eight) hours as needed for nausea or vomiting. 30 tablet 1  . oxyCODONE (OXYCONTIN) 15 mg 12 hr tablet Take 1 tablet (15 mg total) by mouth every 12 (twelve) hours. 30 tablet 0  . polyethylene glycol powder (MIRALAX) powder Take 1 Container by mouth as needed.    . senna (SENOKOT) 8.6 MG TABS tablet Take 1 tablet by mouth as needed for mild constipation.    Mariane Baumgarten Sodium (DULCOLAX STOOL SOFTENER PO) Take by mouth as needed. Reported on 07/31/2015    . HYDROcodone Bitartrate (ZOHYDRO ER) 10 MG C12A Take 10 mg by mouth every 12 (twelve) hours. (Patient not taking: Reported on 07/31/2015) 4 each 0  . oxyCODONE (OXY IR/ROXICODONE) 5 MG immediate release tablet Take 1 tablet (5 mg total) by mouth every 4 (four) hours as needed for severe pain. 60 tablet 0   No current facility-administered medications for this visit.    REVIEW OF SYSTEMS:   Constitutional: Denies fevers, chills or abnormal night sweats Eyes: Denies blurriness of vision, double vision or watery eyes Ears, nose, mouth, throat, and face: Denies mucositis or sore throat Respiratory: Denies cough, dyspnea or wheezes Cardiovascular: Denies palpitation, chest discomfort or lower extremity swelling Gastrointestinal:  Denies nausea, heartburn or change in bowel habits Skin: Denies abnormal skin rashes Lymphatics: Denies new lymphadenopathy or easy  bruising Neurological:Denies numbness, tingling or new weaknesses Behavioral/Psych: Mood is stable, no new changes  All other systems were reviewed with the patient and are negative.  PHYSICAL EXAMINATION: ECOG PERFORMANCE STATUS: 1 - Symptomatic but completely ambulatory  Filed Vitals:   07/31/15 1609  BP: 112/76  Pulse: 107  Temp: 97.9 F (36.6 C)  Resp: 18   Filed Weights   07/31/15 1609  Weight: 181 lb 8 oz (82.328 kg)    GENERAL:alert, no distress and comfortable SKIN: skin color, texture, turgor are normal, no rashes or significant lesions EYES: normal, conjunctiva are pink and non-injected, sclera clear OROPHARYNX:no exudate, no erythema and lips, buccal mucosa, and tongue normal  NECK: supple, thyroid normal size, non-tender, without nodularity LYMPH:  no palpable lymphadenopathy in the cervical, axillary or inguinal LUNGS: clear to auscultation and percussion with normal breathing effort HEART: regular rate &  rhythm and no murmurs and no lower extremity edema ABDOMEN:abdomen soft, Slightly bloated, diffuse tenderness in his lower abdomen, no organomegaly , no palpable mass, normal bowel sounds Musculoskeletal:no cyanosis of digits and no clubbing  PSYCH: alert & oriented x 3 with fluent speech NEURO: no focal motor/sensory deficits  LABORATORY DATA:  I have reviewed the data as listed CBC Latest Ref Rng 07/31/2015 07/15/2015 07/04/2015  WBC 4.0 - 10.3 10e3/uL 9.0 9.7 10.0  Hemoglobin 13.0 - 17.1 g/dL 12.1(L) 13.9 15.4  Hematocrit 38.4 - 49.9 % 36.4(L) 39.2 45.0  Platelets 140 - 400 10e3/uL 512(H) 406(H) 389.0    CMP Latest Ref Rng 07/31/2015 07/04/2015 06/23/2015  Glucose 70 - 140 mg/dl 128 169(H) 326(H)  BUN 7.0 - 26.0 mg/dL 14._0 Creatinine 0.7 - 1.3 mg/dL 0.9 1.02 0.84  Sodium 136 - 145 mEq/L 140 138 137  Potassium 3.5 - 5.1 mEq/L 4.8 4.4 4.1  Chloride 96 - 112 mEq/L - 98 97(L)  CO2 22 - 29 mEq/L 28 32 32(H)  Calcium 8.4 - 10.4 mg/dL 9.9 10.0 9.8  Total  Protein 6.4 - 8.3 g/dL 7.2 7.5 7.5  Total Bilirubin 0.20 - 1.20 mg/dL 0.51 0.8 0.8  Alkaline Phos 40 - 150 U/L 91 97 129(H)  AST 5 - 34 U/L 16 15 45(H)  ALT 0 - 55 U/L 11 36 68(H)   Results for KARNELL, VANDERLOOP (MRN [id number]) as of 07/10/2015 08:31  Ref. Range 07/07/2015 08:51  CA 19-9 Latest Ref Range: <34 U/mL 271 (H)  CEA Latest Units: ng/mL 147.3 (H)  PSA Latest Ref Range: 0.10-4.00 ng/mL 0.87   PATHOLOGY REPORT  Diagnosis 07/15/2015 Omentum, biopsy, RUQ CT - METASTATIC ADENOCARCINOMA. - SEE MICROSCOPIC DESCRIPTION. Microscopic Comment There is adipose tissue which is involved by adenocarcinoma with focal signet ring features. Immunohistochemistry will be performed and reported as an addendum.     RADIOGRAPHIC STUDIES: I have personally reviewed the radiological images as listed and agreed with the findings in the report.  Abdominal MRI with and without contrast 07/28/2068 IMPRESSION: Abdominal peritoneal carcinomatosis and moderate ascites, consistent with metastatic disease.  No definite site of primary carcinoma identified within the abdomen.  Cholelithiasis, without radiographic evidence of acute cholecystitis or biliary ductal dilatation. Hepatic steatosis also noted.  Several bone metastases involving the thoracolumbar spine.   PET 07/11/2015 IMPRESSION: 1. Examination is positive for multifocal hypermetabolic lytic bone metastases. Lesions are identified within the extremities, spine and pelvis. Most concerning is a lytic lesion involving the posterior aspect of the T11 vertebra. If there is a concern for canal involvement then an MRI with contrast material may be helpful for further assessment. 2. Hypermetabolic spiculated nodule within the left upper lobe is identified. This may represent a primary pulmonary malignancy or a focus of metastatic disease. 3. Evidence of peritoneal carcinomatosis with presumed malignant ascites and omental  caking.  IMPRESSION: Abdominal peritoneal carcinomatosis and moderate ascites, consistent with metastatic disease.  No definite site of primary carcinoma identified within the abdomen.  Cholelithiasis, without radiographic evidence of acute cholecystitis or biliary ductal dilatation. Hepatic steatosis also noted.  Several bone metastases involving the thoracolumbar spine.   EGD AND COLONOSCOPY 07/09/2015 Impression: A Grade A esophagitis. Biopsied. - Small hiatal hernia. - The examination was otherwise normal. - One 9 mm polyp in the transverse colon, removed with a cold snare. Resected and retrieved. - The examination was otherwise normal on direct and retroflexion views.   ASSESSMENT & PLAN:  55 year old Caucasian male, without  significant past medical history, former smoker with 20 pack years smoking history, presented with abdominal pain, anorexia, weight loss.   1. 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 -His CT and PET scan findings are highly suspicious for metastatic malignancy. There is no convincing primary tumor. His EGD and colonoscop was negative for primary tumor. -The 9 mm left upper lung nodule is very suspicious for a primary lung cancer, but not very typical as the primary tumor which causes the diffuse metastatic disease due to its small size. It also could be a metastatic lesion.  -His tumor marker showed significant elevated CEA and CA 19.9, which supports for a upper GI primary. -The omental biopsy confirmed metastatic adenocarcinoma, however the IHC studies were not conclusive. CK 7 positive supporting primary lung or upper GI, however the other lung or GI marker were negative. -abdominal MRI was negative for pancreatic or biliary primary. He has no anemia, makes small bowel  primary less likely.  -I discussed the PDL 1 IHC study results, which was negative (0%), he is not a candidate for first-line immunotherapy -Foundation one genomic testing results is still pending at this point, anticipate it will return early next week -if Foundation one showed no targetable mutations, I recommend him to start chemotherapy with carboplatin and pemetrexed. Potential benefit and side effects were discussed with patient. He is on radiation now, giving his good performance status and baseline health, I think he is able to tolerate concurrent chemotherapy with dose reduction. Due to the rapid progression of the disease, I recommend him to start chemotherapy soon, likely next week. -We reviewed that this is incurable disease, and his overall prognosis is very poor. We discussed the mediate survival For metastatic lung cancer, and the benefit of systemic therapy.  - I'll also obtain a brain MRI to rule out CNS metastasis, which is scheduled for tomorrow   2. Abdominal pain and bloating  -he has started OxyContin 10 mg twice daily, tolerating well -I will switch his Vicodin to oxycodone 5 mg every 4 hours as needed -He has moderate ascites, if he becomes more symptomatic, I'll arrange paracentesis -I again discussed the management of constipation   3. 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 Lisbeth Renshaw, and is starting radiation to the T11 lesion and left pelvic bone lesions  - if he responds to systemic therapy, I'll consider Xgeva for his bone metastasis.   3. Anorexia and weight loss -I encouraged him to try nutritional supplement, such as  Glucerna   -NUTRITION consult  4. Newly diagnosed diabetes -He will follow-up with his primary care physician, he has recently started metformin -I encouraged him to monitor his blood work was at home  Plan for today -B12 injection -he is scheduled to have brain MRI  tomorrow -I'll call him early next week when FO result returns. If no other therapy available, I'll schedule his chemotherapy class next week. -I will tentatively schedule his first cycle of carboplatin and Alimta next Friday, April 28 -He will continue radiation, plan to finish on May 10 -I'll see him back next Friday  All questions were answered. The patient knows to call the clinic with any problems, questions or concerns. I spent 35 minutes counseling the patient face to face. The total time spent in  the appointment was 40 minutes and more than 50% was on counseling.     Truitt Merle, MD 07/31/2015 5:56 PM

## 2015-08-01 ENCOUNTER — Telehealth: Payer: Self-pay | Admitting: Hematology

## 2015-08-01 ENCOUNTER — Encounter: Payer: Self-pay | Admitting: Radiation Oncology

## 2015-08-01 ENCOUNTER — Ambulatory Visit
Admission: RE | Admit: 2015-08-01 | Discharge: 2015-08-01 | Disposition: A | Discharge status: Home / Self Care | Payer: Commercial Managed Care - PPO | Source: Ambulatory Visit | Attending: Radiation Oncology | Admitting: Radiation Oncology

## 2015-08-01 ENCOUNTER — Ambulatory Visit: Payer: Commercial Managed Care - PPO

## 2015-08-01 ENCOUNTER — Ambulatory Visit (HOSPITAL_COMMUNITY): Admission: RE | Admit: 2015-08-01 | Payer: Commercial Managed Care - PPO | Source: Ambulatory Visit

## 2015-08-01 VITALS — BP 116/78 | HR 120 | Temp 98.4°F | Resp 18 | Ht 70.0 in | Wt 184.0 lb

## 2015-08-01 DIAGNOSIS — C799 Secondary malignant neoplasm of unspecified site: Secondary | ICD-10-CM

## 2015-08-01 DIAGNOSIS — C7951 Secondary malignant neoplasm of bone: Secondary | ICD-10-CM

## 2015-08-01 DIAGNOSIS — Z510 Encounter for antineoplastic radiation therapy: Secondary | ICD-10-CM | POA: Diagnosis not present

## 2015-08-01 NOTE — Progress Notes (Signed)
Department of Radiation Oncology  Phone:  [phone] Fax:        [phone]  Weekly Treatment Note    Name: Jonathon Cline Date: 08/01/2015 MRN: [id number] DOB: 1961/03/29   Diagnosis:     ICD-9-CM ICD-10-CM   1. Multiple lesions of metastatic malignancy ([place]) 199.1 C79.9 US Paracentesis  2. Metastatic adenocarcinoma of unknown origin ([place]) 199.1 C80.1 US Paracentesis  3. 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 bisacodyl (DULCOLAX) 5 MG EC tablet Take 5 mg by mouth daily as needed for moderate constipation. Reported on 07/31/2015    . blood glucose meter kit and supplies KIT Dispense based on patient and insurance preference. Use up to four times daily as directed. E11.65 1 each 0  . Docusate Sodium (DULCOLAX STOOL SOFTENER PO) Take by mouth as needed. Reported on 07/31/2015    . HYDROcodone-acetaminophen (NORCO/VICODIN) 5-325 MG tablet Take 1 tablet by mouth every 6 (six) hours as needed for moderate pain. 90 tablet 0  . magnesium citrate SOLN Take 1 Bottle by mouth daily as needed for severe constipation.    . metFORMIN (GLUCOPHAGE) 500 MG tablet Take 1 tablet (500 mg total) by mouth 2 (two) times daily with a meal. 60 tablet 0  . ondansetron (ZOFRAN) 4 MG tablet Take 1 tablet (4 mg total) by mouth every 8 (eight) hours as needed for nausea or vomiting. 30 tablet 1  . oxyCODONE (OXY IR/ROXICODONE) 5 MG immediate release tablet Take 1 tablet (5 mg total) by mouth every 4 (four) hours as needed for severe pain. 60 tablet 0  . polyethylene glycol powder (MIRALAX) powder Take 1 Container by mouth as needed.    . senna (SENOKOT) 8.6 MG TABS tablet Take 1 tablet by mouth as needed for mild constipation.    Marland Kitchen HYDROcodone Bitartrate (ZOHYDRO ER) 10 MG C12A Take 10 mg by mouth every 12 (twelve) hours. (Patient not taking: Reported on 07/31/2015) 4 each 0  .  oxyCODONE (OXYCONTIN) 15 mg 12 hr tablet Take 1 tablet (15 mg total) by mouth every 12 (twelve) hours. (Patient not taking: Reported on 08/01/2015) 30 tablet 0   No current facility-administered medications for this encounter.     ALLERGIES: Review of patient's allergies indicates no known allergies.   LABORATORY DATA:  Lab Results  Component Value Date   WBC 9.0 07/31/2015   HGB 12.1* 07/31/2015   HCT 36.4* 07/31/2015   MCV 83.9 07/31/2015   PLT 512* 07/31/2015   Lab Results  Component Value Date   NA 140 07/31/2015   K 4.8 07/31/2015   CL 98 07/04/2015   CO2 28 07/31/2015   Lab Results  Component Value Date   ALT 11 07/31/2015   AST 16 07/31/2015   ALKPHOS 91 07/31/2015   BILITOT 0.51 07/31/2015     NARRATIVE: Jonathon Cline was seen today for weekly treatment management. The chart was checked and the patient's films were reviewed.  Mr. Polhamus has received 2 fractions to his left rib, T8-T12 and right hip.  Skin to treatment fields with any change.  Radiaplex gel given today with education.  Appetite is good eating small amounts at a time.  Has  Abdominal swelling, has a hard time having bowel movements.   Denies any swallowing problems or right hip pain abdominal pain 8/10 taking Oxycodone and Hydrocodone for pain control.  Having fatigue during the day it comes and goes. Wt Readings from Last 3 Encounters:  08/01/15 184 lb (83.462 kg)  07/31/15 181 lb 8 oz (82.328 kg)  07/17/15 186 lb 9.6 oz (84.641 kg)    BP 116/78 mmHg  Pulse 120  Temp(Src) 98.4 F (36.9 C) (Oral)  Resp 18  Ht _0  (1.778 m)  Wt 184 lb (83.462 kg)  BMI 26.40 kg/m2  SpO2 95%  PHYSICAL EXAMINATION: height is _1  (1.778 m) and weight is 184 lb (83.462 kg). His oral temperature is 98.4 F (36.9 C). His blood pressure is 116/78 and his pulse is 120. His respiration is 18 and oxygen saturation is 95%.        ASSESSMENT: The patient is doing satisfactorily with treatment.  PLAN: We will  continue with the patient's radiation treatment as planned.

## 2015-08-01 NOTE — Telephone Encounter (Signed)
lvm for pt for 4.28 appt.Marland KitchenMarland KitchenMarland Kitchen

## 2015-08-01 NOTE — Progress Notes (Signed)
Jonathon Cline has received 2 fractions to his left rib, T8-T12 and right hip.  Skin to treatment fields with any change.  Radiaplex gel given today with education.  Appetite is good eating small amounts at a time.  Has  Abdominal swelling, has a hard time having bowel movements.   Denies any swallowing problems or right hip pain abdominal pain 8/10 taking Oxycodone and Hydrocodone for pain control.   Having fatigue during the day it comes and goes. Wt Readings from Last 3 Encounters:  08/01/15 184 lb (83.462 kg)  07/31/15 181 lb 8 oz (82.328 kg)  07/17/15 186 lb 9.6 oz (84.641 kg)    BP 116/78 mmHg  Pulse 120  Temp(Src) 98.4 F (36.9 C) (Oral)  Resp 18  Ht 5\' 10"  (1.778 m)  Wt 184 lb (83.462 kg)  BMI 26.40 kg/m2  SpO2 95%

## 2015-08-04 ENCOUNTER — Ambulatory Visit: Payer: Commercial Managed Care - PPO

## 2015-08-04 ENCOUNTER — Ambulatory Visit (HOSPITAL_COMMUNITY): Payer: Commercial Managed Care - PPO

## 2015-08-04 ENCOUNTER — Telehealth: Payer: Self-pay | Admitting: *Deleted

## 2015-08-04 ENCOUNTER — Ambulatory Visit
Admission: RE | Admit: 2015-08-04 | Discharge: 2015-08-04 | Disposition: A | Discharge status: Home / Self Care | Payer: Commercial Managed Care - PPO | Source: Ambulatory Visit | Attending: Radiation Oncology | Admitting: Radiation Oncology

## 2015-08-04 NOTE — Telephone Encounter (Signed)
Called patient to inform of [place] for 08-05-15- arrival time - 9:45 am, no restrictions for this test, lvm for a return call

## 2015-08-05 ENCOUNTER — Telehealth: Payer: Self-pay | Admitting: *Deleted

## 2015-08-05 ENCOUNTER — Telehealth: Payer: Self-pay | Admitting: Radiation Oncology

## 2015-08-05 ENCOUNTER — Ambulatory Visit (HOSPITAL_COMMUNITY): Admission: RE | Admit: 2015-08-05 | Payer: Commercial Managed Care - PPO | Source: Ambulatory Visit

## 2015-08-05 ENCOUNTER — Ambulatory Visit: Payer: Commercial Managed Care - PPO

## 2015-08-05 ENCOUNTER — Ambulatory Visit
Admission: RE | Admit: 2015-08-05 | Discharge: 2015-08-05 | Disposition: A | Discharge status: Home / Self Care | Payer: Commercial Managed Care - PPO | Source: Ambulatory Visit | Attending: Radiation Oncology | Admitting: Radiation Oncology

## 2015-08-05 ENCOUNTER — Ambulatory Visit (HOSPITAL_COMMUNITY)
Admission: RE | Admit: 2015-08-05 | Discharge: 2015-08-05 | Disposition: A | Discharge status: Home / Self Care | Payer: Commercial Managed Care - PPO | Source: Ambulatory Visit | Attending: Radiation Oncology | Admitting: Radiation Oncology

## 2015-08-05 DIAGNOSIS — C799 Secondary malignant neoplasm of unspecified site: Secondary | ICD-10-CM | POA: Insufficient documentation

## 2015-08-05 DIAGNOSIS — Z510 Encounter for antineoplastic radiation therapy: Secondary | ICD-10-CM | POA: Diagnosis not present

## 2015-08-05 DIAGNOSIS — C801 Malignant (primary) neoplasm, unspecified: Secondary | ICD-10-CM | POA: Insufficient documentation

## 2015-08-05 DIAGNOSIS — R188 Other ascites: Secondary | ICD-10-CM | POA: Diagnosis not present

## 2015-08-05 NOTE — Telephone Encounter (Signed)
Called patient to inform that test for 08-05-15 - has been moved to 2 pm today, arrival time - 1:45 pm @ [facility] , no restrictions to test, lvm for a return call

## 2015-08-05 NOTE — Telephone Encounter (Signed)
The patient's wife called this morning to communicate that she had not heard anything you about paracentesis despite the fact that this was already scheduled for 10:00. Patient is unable to make this appointment and this was rescheduled to 2:00 this afternoon. This was also communicated by leaving a voicemail again for the wife. She also discusses that he has been experiencing some shoulder pain on the right side positioning during treatment, he discusses this may be secondary to all the intra-abdominal pressure on his diaphragm, and would expect that if this is related that this would be ameliorated by paracentesis. We will continue to follow this closely and assess as needed. There is no rash noted or injury that occurred to her knowledge.

## 2015-08-05 NOTE — Procedures (Signed)
Ultrasound-guided diagnostic and therapeutic paracentesis performed yielding 5  liters of dark bloody fluid. No immediate complications. The fluid was sent to the lab for cytology. Dr. Ernestina Penna office was notified of the above findings.

## 2015-08-05 NOTE — Telephone Encounter (Signed)
Returned call  To wife, she stated"patient was too sick yesterday for radiation but is coming today to try, that his right shoulder is in a lot of pain, was told by RT Therapist they thought it was the way his arm are positioned with radiation, he has hydrocodone prn , she also feels that he needs fluid drained off his abdomen,so swollen, asked that Shona Simpson NP to call her to discuss this today, constipation, taking colace but last BM was on Friday, will forward this to [place] and patient can be seen before or after radiation if needed,wife thanked this RN for return call and waiting to hear from [place] 9:05 AM

## 2015-08-06 ENCOUNTER — Ambulatory Visit
Admission: RE | Admit: 2015-08-06 | Discharge: 2015-08-06 | Disposition: A | Discharge status: Home / Self Care | Payer: Commercial Managed Care - PPO | Source: Ambulatory Visit | Attending: Radiation Oncology | Admitting: Radiation Oncology

## 2015-08-06 ENCOUNTER — Ambulatory Visit: Payer: Commercial Managed Care - PPO

## 2015-08-06 DIAGNOSIS — Z510 Encounter for antineoplastic radiation therapy: Secondary | ICD-10-CM | POA: Diagnosis not present

## 2015-08-07 ENCOUNTER — Ambulatory Visit: Payer: Commercial Managed Care - PPO

## 2015-08-07 ENCOUNTER — Telehealth: Payer: Self-pay | Admitting: *Deleted

## 2015-08-07 ENCOUNTER — Ambulatory Visit
Admission: RE | Admit: 2015-08-07 | Discharge: 2015-08-07 | Disposition: A | Discharge status: Home / Self Care | Payer: Commercial Managed Care - PPO | Source: Ambulatory Visit | Attending: Radiation Oncology | Admitting: Radiation Oncology

## 2015-08-07 DIAGNOSIS — Z510 Encounter for antineoplastic radiation therapy: Secondary | ICD-10-CM | POA: Diagnosis not present

## 2015-08-07 NOTE — Telephone Encounter (Signed)
Wife called back and left VM that he has too much going on on Friday and can't come to chemo class. Asking for session on M-W-Th next week. Forwarded this request to collaborative nurse.

## 2015-08-07 NOTE — Telephone Encounter (Signed)
Spoke with wife Jan and reinforced the need to attend chemo education class before chemo treatment.   Encouraged Jan to attend the class if pt does not feel like attending.;  Wife to relay information back to pt .  Jan voiced understanding and stated she would keep chemo class as scheduled.

## 2015-08-07 NOTE — Telephone Encounter (Signed)
Oncology Nurse Navigator Documentation  Oncology Nurse Navigator Flowsheets 08/07/2015  Navigator Location [facility]  Navigator Encounter Type Telephone  Telephone Incoming Call;Outgoing Call  Barriers/Navigation Needs Coordination of Care  Interventions Coordination of Care  Coordination of Care Appts  Time Spent with Patient 15  Wife called to inquire about a chemo teaching class before his first treatment on Friday. Scheduled class for 4/28 at 0930 and called wife and left VM with the appointment. Requested she call back if this will not work for him. Left message for wife that this navigator will provide him contact information for his new navigator, Norton Blizzard who follows the lung cancer population.

## 2015-08-08 ENCOUNTER — Encounter: Payer: Self-pay | Admitting: *Deleted

## 2015-08-08 ENCOUNTER — Other Ambulatory Visit: Payer: Commercial Managed Care - PPO

## 2015-08-08 ENCOUNTER — Telehealth: Payer: Self-pay | Admitting: Hematology

## 2015-08-08 ENCOUNTER — Other Ambulatory Visit (HOSPITAL_BASED_OUTPATIENT_CLINIC_OR_DEPARTMENT_OTHER): Payer: Commercial Managed Care - PPO

## 2015-08-08 ENCOUNTER — Encounter (HOSPITAL_COMMUNITY): Payer: Self-pay

## 2015-08-08 ENCOUNTER — Ambulatory Visit
Admission: RE | Admit: 2015-08-08 | Discharge: 2015-08-08 | Disposition: A | Discharge status: Home / Self Care | Payer: Commercial Managed Care - PPO | Source: Ambulatory Visit | Attending: Radiation Oncology | Admitting: Radiation Oncology

## 2015-08-08 ENCOUNTER — Ambulatory Visit (HOSPITAL_BASED_OUTPATIENT_CLINIC_OR_DEPARTMENT_OTHER): Payer: Commercial Managed Care - PPO | Admitting: Hematology

## 2015-08-08 ENCOUNTER — Ambulatory Visit (HOSPITAL_COMMUNITY): Payer: Commercial Managed Care - PPO

## 2015-08-08 ENCOUNTER — Encounter: Payer: Self-pay | Admitting: Hematology

## 2015-08-08 ENCOUNTER — Ambulatory Visit: Payer: Commercial Managed Care - PPO

## 2015-08-08 ENCOUNTER — Ambulatory Visit (HOSPITAL_BASED_OUTPATIENT_CLINIC_OR_DEPARTMENT_OTHER): Payer: Commercial Managed Care - PPO

## 2015-08-08 ENCOUNTER — Encounter: Payer: Self-pay | Admitting: General Practice

## 2015-08-08 VITALS — BP 110/79 | HR 106 | Temp 97.5°F | Resp 18 | Ht 70.0 in | Wt 173.5 lb

## 2015-08-08 DIAGNOSIS — C7951 Secondary malignant neoplasm of bone: Secondary | ICD-10-CM

## 2015-08-08 DIAGNOSIS — Z5111 Encounter for antineoplastic chemotherapy: Secondary | ICD-10-CM

## 2015-08-08 DIAGNOSIS — C799 Secondary malignant neoplasm of unspecified site: Secondary | ICD-10-CM

## 2015-08-08 DIAGNOSIS — C7800 Secondary malignant neoplasm of unspecified lung: Secondary | ICD-10-CM

## 2015-08-08 DIAGNOSIS — C801 Malignant (primary) neoplasm, unspecified: Secondary | ICD-10-CM

## 2015-08-08 DIAGNOSIS — E119 Type 2 diabetes mellitus without complications: Secondary | ICD-10-CM

## 2015-08-08 DIAGNOSIS — Z794 Long term (current) use of insulin: Secondary | ICD-10-CM

## 2015-08-08 DIAGNOSIS — E118 Type 2 diabetes mellitus with unspecified complications: Secondary | ICD-10-CM

## 2015-08-08 DIAGNOSIS — R109 Unspecified abdominal pain: Secondary | ICD-10-CM

## 2015-08-08 DIAGNOSIS — G893 Neoplasm related pain (acute) (chronic): Secondary | ICD-10-CM

## 2015-08-08 DIAGNOSIS — R634 Abnormal weight loss: Secondary | ICD-10-CM

## 2015-08-08 DIAGNOSIS — C786 Secondary malignant neoplasm of retroperitoneum and peritoneum: Secondary | ICD-10-CM

## 2015-08-08 DIAGNOSIS — Z510 Encounter for antineoplastic radiation therapy: Secondary | ICD-10-CM | POA: Diagnosis not present

## 2015-08-08 DIAGNOSIS — R140 Abdominal distension (gaseous): Secondary | ICD-10-CM

## 2015-08-08 DIAGNOSIS — R630 Anorexia: Secondary | ICD-10-CM

## 2015-08-08 LAB — COMPREHENSIVE METABOLIC PANEL
ALK PHOS: 87 U/L (ref 40–150)
ALT: 13 U/L (ref 0–55)
ANION GAP: 13 meq/L — AB (ref 3–11)
AST: 16 U/L (ref 5–34)
Albumin: 2.3 g/dL — ABNORMAL LOW (ref 3.5–5.0)
BILIRUBIN TOTAL: 0.4 mg/dL (ref 0.20–1.20)
BUN: 18.4 mg/dL (ref 7.0–26.0)
CO2: 27 meq/L (ref 22–29)
Calcium: 9.9 mg/dL (ref 8.4–10.4)
Chloride: 98 mEq/L (ref 98–109)
Creatinine: 0.8 mg/dL (ref 0.7–1.3)
Glucose: 150 mg/dl — ABNORMAL HIGH (ref 70–140)
Potassium: 4.9 mEq/L (ref 3.5–5.1)
Sodium: 137 mEq/L (ref 136–145)
TOTAL PROTEIN: 6.8 g/dL (ref 6.4–8.3)

## 2015-08-08 LAB — CBC WITH DIFFERENTIAL/PLATELET
BASO%: 0.3 % (ref 0.0–2.0)
BASOS ABS: 0 10*3/uL (ref 0.0–0.1)
EOS ABS: 0 10*3/uL (ref 0.0–0.5)
EOS%: 0.5 % (ref 0.0–7.0)
HCT: 35 % — ABNORMAL LOW (ref 38.4–49.9)
HGB: 11.7 g/dL — ABNORMAL LOW (ref 13.0–17.1)
LYMPH%: 9.9 % — AB (ref 14.0–49.0)
MCH: 27.4 pg (ref 27.2–33.4)
MCHC: 33.4 g/dL (ref 32.0–36.0)
MCV: 82 fL (ref 79.3–98.0)
MONO#: 0.8 10*3/uL (ref 0.1–0.9)
MONO%: 9.9 % (ref 0.0–14.0)
NEUT%: 79.4 % — AB (ref 39.0–75.0)
NEUTROS ABS: 6.3 10*3/uL (ref 1.5–6.5)
PLATELETS: 453 10*3/uL — AB (ref 140–400)
RBC: 4.27 10*6/uL (ref 4.20–5.82)
RDW: 12.2 % (ref 11.0–14.6)
WBC: 7.9 10*3/uL (ref 4.0–10.3)
lymph#: 0.8 10*3/uL — ABNORMAL LOW (ref 0.9–3.3)

## 2015-08-08 MED ORDER — SODIUM CHLORIDE 0.9 % IV SOLN
507.5000 mg | Freq: Once | INTRAVENOUS | Status: AC
  Administered 2015-08-08: 510 mg via INTRAVENOUS
  Filled 2015-08-08: qty 51

## 2015-08-08 MED ORDER — PROCHLORPERAZINE MALEATE 10 MG PO TABS: 10.0000 mg | ORAL_TABLET | Freq: Four times a day (QID) | ORAL | Status: AC | PRN

## 2015-08-08 MED ORDER — OXYCODONE HCL ER 15 MG PO T12A: 15.0000 mg | EXTENDED_RELEASE_TABLET | Freq: Two times a day (BID) | ORAL | Status: DC

## 2015-08-08 MED ORDER — PALONOSETRON HCL INJECTION 0.25 MG/5ML
0.2500 mg | Freq: Once | INTRAVENOUS | Status: AC
  Administered 2015-08-08: 0.25 mg via INTRAVENOUS

## 2015-08-08 MED ORDER — PALONOSETRON HCL INJECTION 0.25 MG/5ML
INTRAVENOUS | Status: AC
  Filled 2015-08-08: qty 5

## 2015-08-08 MED ORDER — DEXAMETHASONE SODIUM PHOSPHATE 100 MG/10ML IJ SOLN
10.0000 mg | Freq: Once | INTRAMUSCULAR | Status: AC
  Administered 2015-08-08: 10 mg via INTRAVENOUS
  Filled 2015-08-08: qty 1

## 2015-08-08 MED ORDER — FOLIC ACID 1 MG PO TABS: 1.0000 mg | ORAL_TABLET | Freq: Every day | ORAL | Status: AC

## 2015-08-08 MED ORDER — SODIUM CHLORIDE 0.9 % IV SOLN
Freq: Once | INTRAVENOUS | Status: AC
  Administered 2015-08-08: 15:00:00 via INTRAVENOUS

## 2015-08-08 MED ORDER — LORAZEPAM 2 MG/ML IJ SOLN
INTRAMUSCULAR | Status: AC
  Filled 2015-08-08: qty 1

## 2015-08-08 MED ORDER — SODIUM CHLORIDE 0.9 % IV SOLN
400.0000 mg/m2 | Freq: Once | INTRAVENOUS | Status: AC
  Administered 2015-08-08: 800 mg via INTRAVENOUS
  Filled 2015-08-08: qty 32

## 2015-08-08 MED ORDER — DEXAMETHASONE 4 MG PO TABS: 4.0000 mg | ORAL_TABLET | Freq: Two times a day (BID) | ORAL | Status: AC

## 2015-08-08 MED ORDER — LORAZEPAM 2 MG/ML IJ SOLN
0.5000 mg | Freq: Once | INTRAMUSCULAR | Status: AC
  Administered 2015-08-08: 0.5 mg via INTRAVENOUS

## 2015-08-08 NOTE — Telephone Encounter (Signed)
PT WILL P/U SCHED IN [place].. MD HAD NO AVAILABILITY THE DAY BEFORE OR ON 5/19 SCHED W/ MID

## 2015-08-08 NOTE — Progress Notes (Signed)
Spiritual Care Note  In chemo class this morning, met wife Jonathon Cline, who was tearful and overwhelmed at processing pt's new dx, tx, px.  Attempted to f/u in infusion for support, but pt was alone and sleeping.  Family has Automotive engineer and plan to f/u by phone/in person for further support.  Please also page as needs arise/circumstances change.  Thank you.  [place], [place], [facility] Pager [phone] Voicemail  [phone]

## 2015-08-08 NOTE — Addendum Note (Signed)
Encounter addended by: Kyung Rudd, MD on: 08/08/2015  2:57 PM<BR>     Documentation filed: Notes Section

## 2015-08-08 NOTE — Progress Notes (Signed)
Pt appeared anxious, agitated, restless prior to treatment.  He also appeared uncomfortable but denied pain.  He did c/o nausea and requested a emesis bag before treatment started.  Notified Dr. Burr Medico of apparent anxiety and anticipatory nausea.  Ativan given along w/ other pre meds as ordered.

## 2015-08-08 NOTE — Progress Notes (Signed)
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 male is here because of his abnormal CT scan findings, which is harvest spacers for metastatic malignancy. He is accompanied by his wife and sister to my clinic today.  He had an accident when he was moving a heavy stuff one month ago and he though he pulled his abdominal muscles. He has been having intermittent lower abdominal pain since then, it locates in his low abd , worse when he laugh, cough or after meals, it wakes him up at night sometimes. He also noticed mild chest pain when he coughs or lumps. No dyspnea or productive cough. He denies significant abdomen blating, no nausea or change of BM (except constipation from pain meds), he has lost aboiot 10-15 lbs in the past one month, appetite lower, stopped working one months due to the pain, he takes 2-6 vicodin a day. He was initially seen by urgent care, lab showed hyperglycemia, abnormal liver function. He was referred to gastroenterologist Dr. Ardis Hughs. CT of abdomen and pelvis with contrast was obtained on 07/04/2015, which showed peritoneal carcinomatosis, lytic bone lesions in T9 and T11, no definitive primary malignancy. He underwent EGD and colonoscopy yesterday by Dr. Ardis Hughs, no primary tumor was found.  He has not see her PCP for 5 years, and has scheduled to see PCP next week.   CURRENT THERAPY:  1. He started palliative radiation 07/31/2015 2. First line chemo carboplatin and Alimta starting on April 28, first cycle dose reduction due to RT   INTERIM HISTORY: Jonathon Cline returns  for follow-up. He is accompanied by his wife. He is tolerating radiation well overall, does have mild-to-moderate fatigue. He underwent a paracentesis with 5 L fluid removed about 2 days ago, and feels better overall.  His pain seemed to be better controlled, he has no appetite, and has lost about 10 lbs in the past one week (probably related to his paracentesis).   likely related to  MEDICAL HISTORY:  Past Medical History  Diagnosis Date  . Diabetes [facility])     SURGICAL HISTORY: Past Surgical History  Procedure Laterality Date  . Spine surgery      cervical fusion  . Tonsillectomy      SOCIAL HISTORY: Social History   Social History  . Marital Status: Married    Spouse Name: N/A  . Number of Children: N/A  . Years of Education: N/A   Occupational History  . Not on file.   Social History Main Topics  . Smoking status: Former Smoker -- 1.00 packs/day for 20 years    Quit date: 04/12/2004  . Smokeless tobacco: Never Used  . Alcohol Use: No     Comment: social drinker   . Drug Use: No  . Sexual Activity: Not on file   Other Topics Concern  . Not on file   Social History Narrative   Married, wfe Thayer Headings   Works at [employer] level of education- College   Caffienated beverages-Yes   Seats belts often-Yes   Regular exercise-Yes   Firearms in the [employer]   Physical abuse-NO   Smoke alarm in [employer]  FAMILY HISTORY: Family History  Problem Relation Age of Onset  . Cancer Other     Lung  . Lung cancer Mother   . Lung cancer Father   . Colon cancer Neg Hx   . Esophageal cancer Neg Hx   . Stomach cancer Neg Hx   . Cancer Paternal Grandmother 88    ovarian cancer     ALLERGIES:  has No Known Allergies.  MEDICATIONS:  Current Outpatient Prescriptions  Medication Sig Dispense Refill  . blood glucose meter kit and supplies KIT Dispense based on patient and insurance preference. Use up to four times daily as directed. E11.65 1 each  0  . Docusate Sodium (DULCOLAX STOOL SOFTENER PO) Take by mouth as needed. Reported on 07/31/2015    . magnesium citrate SOLN Take 1 Bottle by mouth daily as needed for severe constipation.    . metFORMIN (GLUCOPHAGE) 500 MG tablet Take 1 tablet (500 mg total) by mouth 2 (two) times daily with a meal. 60 tablet 0  . ondansetron (ZOFRAN) 4 MG tablet Take 1 tablet (4 mg total) by mouth every 8 (eight) hours as needed for nausea or vomiting. 30 tablet 1  . oxyCODONE (OXYCONTIN) 15 mg 12 hr tablet Take 1 tablet (15 mg total) by mouth every 12 (twelve) hours. 60 tablet 0  . polyethylene glycol powder (MIRALAX) powder Take 1 Container by mouth as needed.    . senna (SENOKOT) 8.6 MG TABS tablet Take 1 tablet by mouth as needed for mild constipation.    . bisacodyl (DULCOLAX) 5 MG EC tablet Take 5 mg by mouth daily as needed for moderate constipation. Reported on 08/08/2015    . dexamethasone (DECADRON) 4 MG tablet Take 1 tablet (4 mg total) by mouth 2 (two) times daily. 10 tablet 2  . folic acid (FOLVITE) 1 MG tablet Take 1 tablet (1 mg total) by mouth daily. 30 tablet 3  . oxyCODONE (OXY IR/ROXICODONE) 5 MG immediate release tablet Take 1 tablet (5 mg total) by mouth every 4 (four) hours as needed for severe pain. (Patient not taking: Reported on 08/08/2015) 60 tablet 0  . prochlorperazine (COMPAZINE) 10 MG tablet Take 1 tablet (10 mg total) by mouth every 6 (six) hours as needed for nausea or vomiting. 30 tablet 0   Current Facility-Administered Medications  Medication Dose Route Frequency Provider Last Rate Last Dose  . LORazepam (ATIVAN) injection 0.5 mg  0.5 mg Intravenous Once Truitt Merle, MD        REVIEW OF SYSTEMS:   Constitutional: Denies fevers, chills or abnormal night sweats Eyes: Denies blurriness of vision, double vision or watery eyes Ears, nose, mouth, throat, and face: Denies mucositis or sore throat Respiratory: Denies cough, dyspnea or wheezes Cardiovascular: Denies palpitation, chest  discomfort or lower extremity swelling Gastrointestinal:  Denies nausea, heartburn or change in bowel habits Skin: Denies abnormal skin rashes Lymphatics: Denies new lymphadenopathy or easy bruising Neurological:Denies numbness, tingling or new weaknesses Behavioral/Psych: Mood is stable, no new changes  All other systems were reviewed with the patient and are negative.  PHYSICAL EXAMINATION: ECOG PERFORMANCE STATUS: 1 - Symptomatic but completely ambulatory  Filed Vitals:   08/08/15 1316  BP: 110/79  Pulse: 106  Temp: 97.5 F (36.4 C)  Resp: 18   Filed Weights   08/08/15 1316  Weight: 173 lb 8 oz (78.699 kg)    GENERAL:alert, no distress and comfortable SKIN: skin color, texture, turgor are normal, no rashes or significant lesions EYES: normal, conjunctiva are  pink and non-injected, sclera clear OROPHARYNX:no exudate, no erythema and lips, buccal mucosa, and tongue normal  NECK: supple, thyroid normal size, non-tender, without nodularity LYMPH:  no palpable lymphadenopathy in the cervical, axillary or inguinal LUNGS: clear to auscultation and percussion with normal breathing effort HEART: regular rate & rhythm and no murmurs and no lower extremity edema ABDOMEN:abdomen soft, Slightly bloated, diffuse tenderness in his lower abdomen, no organomegaly , no palpable mass, normal bowel sounds Musculoskeletal:no cyanosis of digits and no clubbing  PSYCH: alert & oriented x 3 with fluent speech NEURO: no focal motor/sensory deficits  LABORATORY DATA:  I have reviewed the data as listed CBC Latest Ref Rng 08/08/2015 07/31/2015 07/15/2015  WBC 4.0 - 10.3 10e3/uL 7.9 9.0 9.7  Hemoglobin 13.0 - 17.1 g/dL 11.7(L) 12.1(L) 13.9  Hematocrit 38.4 - 49.9 % 35.0(L) 36.4(L) 39.2  Platelets 140 - 400 10e3/uL 453(H) 512(H) 406(H)    CMP Latest Ref Rng 08/08/2015 07/31/2015 07/04/2015  Glucose 70 - 140 mg/dl 150(H) 128 169(H)  BUN 7.0 - 26.0 mg/dL 18.4 14.3 11  Creatinine 0.7 - 1.3 mg/dL 0.8 0.9  1.02  Sodium 136 - 145 mEq/L 137 140 138  Potassium 3.5 - 5.1 mEq/L 4.9 4.8 4.4  Chloride 96 - 112 mEq/L - - 98  CO2 22 - 29 mEq/L 27 28 32  Calcium 8.4 - 10.4 mg/dL 9.9 9.9 10.0  Total Protein 6.4 - 8.3 g/dL 6.8 7.2 7.5  Total Bilirubin 0.20 - 1.20 mg/dL 0.40 0.51 0.8  Alkaline Phos 40 - 150 U/L 87 91 97  AST 5 - 34 U/L '16 16 15  ' ALT 0 - 55 U/L 13 11 36   Results for Jonathon Cline, Jonathon Cline (MRN [id number]) as of 07/10/2015 08:31  Ref. Range 07/07/2015 08:51  CA 19-9 Latest Ref Range: <34 U/mL 271 (H)  CEA Latest Units: ng/mL 147.3 (H)  PSA Latest Ref Range: 0.10-4.00 ng/mL 0.87   PATHOLOGY REPORT  Diagnosis 07/15/2015 Omentum, biopsy, RUQ CT - METASTATIC ADENOCARCINOMA. - SEE MICROSCOPIC DESCRIPTION. Microscopic Comment There is adipose tissue which is involved by adenocarcinoma with focal signet ring features. Immunohistochemistry will be performed and reported as an addendum.      RADIOGRAPHIC STUDIES: I have personally reviewed the radiological images as listed and agreed with the findings in the report.  Abdominal MRI with and without contrast 07/28/2068 IMPRESSION: Abdominal peritoneal carcinomatosis and moderate ascites, consistent with metastatic disease.  No definite site of primary carcinoma identified within the abdomen.  Cholelithiasis, without radiographic evidence of acute cholecystitis or biliary ductal dilatation. Hepatic steatosis also noted.  Several bone metastases involving the thoracolumbar spine.   PET 07/11/2015 IMPRESSION: 1. Examination is positive for multifocal hypermetabolic lytic bone metastases. Lesions are identified within the extremities, spine and pelvis. Most concerning is a lytic lesion involving the posterior aspect of the T11 vertebra. If there is a concern for canal involvement then an MRI with contrast material may be helpful for further assessment. 2. Hypermetabolic spiculated nodule within the left upper lobe is identified. This  may represent a primary pulmonary malignancy or a focus of metastatic disease. 3. Evidence of peritoneal carcinomatosis with presumed malignant ascites and omental caking.  IMPRESSION: Abdominal peritoneal carcinomatosis and moderate ascites, consistent with metastatic disease.  No definite site of primary carcinoma identified within the abdomen.  Cholelithiasis, without radiographic evidence of acute cholecystitis or biliary ductal dilatation. Hepatic steatosis also noted.  Several bone metastases involving the thoracolumbar spine.   EGD AND COLONOSCOPY 07/09/2015 Impression: A Grade  A esophagitis. Biopsied. - Small hiatal hernia. - The examination was otherwise normal. - One 9 mm polyp in the transverse colon, removed with a cold snare. Resected and retrieved. - The examination was otherwise normal on direct and retroflexion views.   ASSESSMENT & PLAN:  55 year old Caucasian male, without significant past medical history, former smoker with 20 pack years smoking history, presented with abdominal pain, anorexia, weight loss.   1. 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 -His CT and PET scan findings are highly suspicious for metastatic malignancy. There is no convincing primary tumor. His EGD and colonoscop was negative for primary tumor. -The 9 mm left upper lung nodule is very suspicious for a primary lung cancer, but not very typical as the primary tumor which causes the diffuse metastatic disease due to its small size. It also could be a metastatic lesion.  -His tumor marker showed significant elevated CEA and CA 19.9, which supports for a upper GI primary. -The omental biopsy confirmed metastatic adenocarcinoma, however the IHC studies were not conclusive. CK 7 positive supporting primary  lung or upper GI, however the other lung or GI marker were negative. -abdominal MRI was negative for pancreatic or biliary primary. He has no anemia, makes small bowel primary less likely. -We'll review the incurable nature of his disease.  -I discussed the PDL 1 IHC study results, which was negative (0%), he is not a candidate for first-line immunotherapy -I discussed his Foundation one genomic testing results, which showed KRAS mutation, no actionable mutation identified  - I recommend him to start chemotherapy with carboplatin and pemetrexed. --Chemotherapy consent: Side effects including but does not not limited to, fatigue, nausea, vomiting, diarrhea, hair loss, neuropathy, fluid retention, renal and kidney dysfunction, neutropenic fever, needed for blood transfusion, bleeding, were discussed with patient in great detail. He agrees to proceed. -Due to the concurrent radiation, and fatigue, I'll dose reduce carboplatin AUC 3.5, Alimta 400 mg/m for the first cycle    2. Abdominal pain and bloating  -continue OxyContin 15 mg twice daily, and oxycodone as needed, his pain overall is controlled -He has paracentesis with 5 L fluid is removed on April 26   3. 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   3. Anorexia and weight loss -I encouraged him to try nutritional supplement, such as  Glucerna   -NUTRITION consult  4. Newly diagnosed diabetes -He will follow-up with his primary care physician, he has recently started metformin -I encouraged him to monitor his blood work was at home  Plan for today -first cycle of carboplatin and pemetrexed, with dose reduction, no Neulasta -I'll see him back in 1 week for toxicity check up -I cautery folic acid, dexamethasone today, and refilled his OxyContin  All questions were answered. The patient knows to call the clinic  with any problems, questions or concerns. I spent 35 minutes counseling the patient face to face. The total time spent in the appointment was 40 minutes and more than 50% was on counseling.     Truitt Merle, MD 08/08/2015 2:51 PM

## 2015-08-08 NOTE — Progress Notes (Signed)
 Department of Radiation Oncology  Phone:  [phone] Fax:        [phone]  Weekly Treatment Note    Name: Jonathon Cline Date: 08/08/2015 MRN: [id number] DOB: 03/04/1961   Diagnosis:     ICD-9-CM ICD-10-CM   1. Osseous metastasis (HCC) 198.5 C79.51      Current dose: 15 Gy  Current fraction: 6   MEDICATIONS: Current Outpatient Prescriptions  Medication Sig Dispense Refill  . bisacodyl (DULCOLAX) 5 MG EC tablet Take 5 mg by mouth daily as needed for moderate constipation. Reported on 08/08/2015    . blood glucose meter kit and supplies KIT Dispense based on patient and insurance preference. Use up to four times daily as directed. E11.65 1 each 0  . dexamethasone (DECADRON) 4 MG tablet Take 1 tablet (4 mg total) by mouth 2 (two) times daily. 10 tablet 2  . Docusate Sodium (DULCOLAX STOOL SOFTENER PO) Take by mouth as needed. Reported on 07/31/2015    . folic acid (FOLVITE) 1 MG tablet Take 1 tablet (1 mg total) by mouth daily. 30 tablet 3  . magnesium citrate SOLN Take 1 Bottle by mouth daily as needed for severe constipation.    . metFORMIN (GLUCOPHAGE) 500 MG tablet Take 1 tablet (500 mg total) by mouth 2 (two) times daily with a meal. 60 tablet 0  . ondansetron (ZOFRAN) 4 MG tablet Take 1 tablet (4 mg total) by mouth every 8 (eight) hours as needed for nausea or vomiting. 30 tablet 1  . oxyCODONE (OXY IR/ROXICODONE) 5 MG immediate release tablet Take 1 tablet (5 mg total) by mouth every 4 (four) hours as needed for severe pain. (Patient not taking: Reported on 08/08/2015) 60 tablet 0  . oxyCODONE (OXYCONTIN) 15 mg 12 hr tablet Take 1 tablet (15 mg total) by mouth every 12 (twelve) hours. 60 tablet 0  . polyethylene glycol powder (MIRALAX) powder Take 1 Container by mouth as needed.    . prochlorperazine (COMPAZINE) 10 MG tablet Take 1 tablet (10 mg total) by mouth every 6 (six) hours as needed for nausea or vomiting. 30 tablet 0  . senna (SENOKOT) 8.6 MG TABS tablet Take  1 tablet by mouth as needed for mild constipation.     No current facility-administered medications for this encounter.   Facility-Administered Medications Ordered in Other Encounters  Medication Dose Route Frequency Provider Last Rate Last Dose  . 0.9 %  sodium chloride infusion   Intravenous Once Yan Feng, MD      . CARBOplatin (PARAPLATIN) 510 mg in sodium chloride 0.9 % 250 mL chemo infusion  510 mg Intravenous Once Yan Feng, MD      . dexamethasone (DECADRON) 10 mg in sodium chloride 0.9 % 50 mL IVPB  10 mg Intravenous Once Yan Feng, MD      . palonosetron (ALOXI) injection 0.25 mg  0.25 mg Intravenous Once Yan Feng, MD      . PEMEtrexed (ALIMTA) 800 mg in sodium chloride 0.9 % 100 mL chemo infusion  400 mg/m2 (Treatment Plan Actual) Intravenous Once Yan Feng, MD         ALLERGIES: Review of patient's allergies indicates no known allergies.   LABORATORY DATA:  Lab Results  Component Value Date   WBC 7.9 08/08/2015   HGB 11.7* 08/08/2015   HCT 35.0* 08/08/2015   MCV 82.0 08/08/2015   PLT 453* 08/08/2015   Lab Results  Component Value Date   NA 137 08/08/2015   K 4.9 08/08/2015     CL 98 07/04/2015   CO2 27 08/08/2015   Lab Results  Component Value Date   ALT 13 08/08/2015   AST 16 08/08/2015   ALKPHOS 87 08/08/2015   BILITOT 0.40 08/08/2015     NARRATIVE: Jonathon Cline was seen today for weekly treatment management. The chart was checked and the patient's films were reviewed.  The patient states he feels much better after paracentesis. No significant change in his pain so far.  PHYSICAL EXAMINATION:     Alert, no acute distress.   ASSESSMENT: The patient is doing satisfactorily with treatment.  PLAN: We will continue with the patient's radiation treatment as planned.         

## 2015-08-08 NOTE — Patient Instructions (Addendum)
[facility] Discharge Instructions for Patients Receiving Chemotherapy  Today you received the following chemotherapy agents;  Carboplatin and Alimta.   To help prevent nausea and vomiting after your treatment, we encourage you to take your nausea medication: Compazine. Take one every 6 hours as needed.    If you develop nausea and vomiting that is not controlled by your nausea medication, call the clinic.   BELOW ARE SYMPTOMS THAT SHOULD BE REPORTED IMMEDIATELY:  *FEVER GREATER THAN 100.5 F  *CHILLS WITH OR WITHOUT FEVER  NAUSEA AND VOMITING THAT IS NOT CONTROLLED WITH YOUR NAUSEA MEDICATION  *UNUSUAL SHORTNESS OF BREATH  *UNUSUAL BRUISING OR BLEEDING  TENDERNESS IN MOUTH AND THROAT WITH OR WITHOUT PRESENCE OF ULCERS  *URINARY PROBLEMS  *BOWEL PROBLEMS  UNUSUAL RASH Items with * indicate a potential emergency and should be followed up as soon as possible.  Feel free to call the clinic should you have any questions or concerns. The clinic phone number is [phone].  Please show the [facility] at check-in to the Emergency Department and triage nurse.

## 2015-08-11 ENCOUNTER — Ambulatory Visit: Payer: Commercial Managed Care - PPO

## 2015-08-11 ENCOUNTER — Ambulatory Visit
Admission: RE | Admit: 2015-08-11 | Discharge: 2015-08-11 | Disposition: A | Discharge status: Home / Self Care | Payer: Commercial Managed Care - PPO | Source: Ambulatory Visit | Attending: Radiation Oncology | Admitting: Radiation Oncology

## 2015-08-11 ENCOUNTER — Telehealth: Payer: Self-pay

## 2015-08-11 DIAGNOSIS — Z510 Encounter for antineoplastic radiation therapy: Secondary | ICD-10-CM | POA: Diagnosis not present

## 2015-08-11 NOTE — Telephone Encounter (Signed)
-----   Message from Cathlean Cower, RN sent at 08/08/2015  3:12 PM EDT ----- Regarding: chemo f/u call 1st time Alimta and Carboplatin on 4/28-  Dr Burr Medico

## 2015-08-11 NOTE — Telephone Encounter (Signed)
Left message for patient to call with concerns or questions

## 2015-08-12 ENCOUNTER — Ambulatory Visit (HOSPITAL_COMMUNITY)
Admission: RE | Admit: 2015-08-12 | Discharge: 2015-08-12 | Disposition: A | Discharge status: Home / Self Care | Payer: Commercial Managed Care - PPO | Source: Ambulatory Visit | Attending: Hematology | Admitting: Hematology

## 2015-08-12 ENCOUNTER — Ambulatory Visit: Payer: Commercial Managed Care - PPO

## 2015-08-12 ENCOUNTER — Other Ambulatory Visit: Payer: Self-pay | Admitting: *Deleted

## 2015-08-12 ENCOUNTER — Ambulatory Visit
Admission: RE | Admit: 2015-08-12 | Discharge: 2015-08-12 | Disposition: A | Discharge status: Home / Self Care | Payer: Commercial Managed Care - PPO | Source: Ambulatory Visit | Attending: Radiation Oncology | Admitting: Radiation Oncology

## 2015-08-12 DIAGNOSIS — C801 Malignant (primary) neoplasm, unspecified: Secondary | ICD-10-CM | POA: Insufficient documentation

## 2015-08-12 DIAGNOSIS — R9089 Other abnormal findings on diagnostic imaging of central nervous system: Secondary | ICD-10-CM | POA: Diagnosis not present

## 2015-08-12 DIAGNOSIS — C799 Secondary malignant neoplasm of unspecified site: Secondary | ICD-10-CM

## 2015-08-12 DIAGNOSIS — Z510 Encounter for antineoplastic radiation therapy: Secondary | ICD-10-CM | POA: Diagnosis not present

## 2015-08-12 MED ORDER — METFORMIN HCL 500 MG PO TABS: 500.0000 mg | ORAL_TABLET | Freq: Two times a day (BID) | ORAL | Status: AC

## 2015-08-12 MED ORDER — GADOBENATE DIMEGLUMINE 529 MG/ML IV SOLN
16.0000 mL | Freq: Once | INTRAVENOUS | Status: AC | PRN
  Administered 2015-08-12: 17 mL via INTRAVENOUS

## 2015-08-12 NOTE — Telephone Encounter (Signed)
Received call from pt wife stating wanting to know if husband need to continue taking metformin that was rx @ urgent care. Per OV 4/5, md want pt to continue taking inform wife will send updated script to [facility]...Johny Chess

## 2015-08-13 ENCOUNTER — Ambulatory Visit: Payer: Commercial Managed Care - PPO

## 2015-08-13 ENCOUNTER — Ambulatory Visit
Admission: RE | Admit: 2015-08-13 | Discharge: 2015-08-13 | Disposition: A | Discharge status: Home / Self Care | Payer: Commercial Managed Care - PPO | Source: Ambulatory Visit | Attending: Radiation Oncology | Admitting: Radiation Oncology

## 2015-08-13 DIAGNOSIS — Z510 Encounter for antineoplastic radiation therapy: Secondary | ICD-10-CM | POA: Diagnosis not present

## 2015-08-14 ENCOUNTER — Ambulatory Visit
Admission: RE | Admit: 2015-08-14 | Discharge: 2015-08-14 | Disposition: A | Discharge status: Home / Self Care | Payer: Commercial Managed Care - PPO | Source: Ambulatory Visit | Attending: Radiation Oncology | Admitting: Radiation Oncology

## 2015-08-14 ENCOUNTER — Other Ambulatory Visit (HOSPITAL_BASED_OUTPATIENT_CLINIC_OR_DEPARTMENT_OTHER): Payer: Commercial Managed Care - PPO

## 2015-08-14 ENCOUNTER — Ambulatory Visit: Payer: Commercial Managed Care - PPO

## 2015-08-14 ENCOUNTER — Ambulatory Visit (HOSPITAL_BASED_OUTPATIENT_CLINIC_OR_DEPARTMENT_OTHER): Payer: Commercial Managed Care - PPO | Admitting: Hematology

## 2015-08-14 ENCOUNTER — Encounter: Payer: Self-pay | Admitting: Hematology

## 2015-08-14 VITALS — BP 115/76 | HR 107 | Temp 97.6°F | Resp 18 | Ht 70.0 in | Wt 174.3 lb

## 2015-08-14 DIAGNOSIS — R630 Anorexia: Secondary | ICD-10-CM

## 2015-08-14 DIAGNOSIS — C786 Secondary malignant neoplasm of retroperitoneum and peritoneum: Secondary | ICD-10-CM | POA: Diagnosis not present

## 2015-08-14 DIAGNOSIS — Z510 Encounter for antineoplastic radiation therapy: Secondary | ICD-10-CM | POA: Diagnosis not present

## 2015-08-14 DIAGNOSIS — Z794 Long term (current) use of insulin: Secondary | ICD-10-CM

## 2015-08-14 DIAGNOSIS — C7800 Secondary malignant neoplasm of unspecified lung: Secondary | ICD-10-CM

## 2015-08-14 DIAGNOSIS — R140 Abdominal distension (gaseous): Secondary | ICD-10-CM

## 2015-08-14 DIAGNOSIS — E119 Type 2 diabetes mellitus without complications: Secondary | ICD-10-CM

## 2015-08-14 DIAGNOSIS — C799 Secondary malignant neoplasm of unspecified site: Secondary | ICD-10-CM

## 2015-08-14 DIAGNOSIS — G893 Neoplasm related pain (acute) (chronic): Secondary | ICD-10-CM

## 2015-08-14 DIAGNOSIS — C801 Malignant (primary) neoplasm, unspecified: Secondary | ICD-10-CM

## 2015-08-14 DIAGNOSIS — R109 Unspecified abdominal pain: Secondary | ICD-10-CM | POA: Diagnosis not present

## 2015-08-14 DIAGNOSIS — E118 Type 2 diabetes mellitus with unspecified complications: Secondary | ICD-10-CM

## 2015-08-14 DIAGNOSIS — Z87891 Personal history of nicotine dependence: Secondary | ICD-10-CM

## 2015-08-14 DIAGNOSIS — R634 Abnormal weight loss: Secondary | ICD-10-CM

## 2015-08-14 LAB — CBC WITH DIFFERENTIAL/PLATELET
BASO%: 0 % (ref 0.0–2.0)
BASOS ABS: 0 10*3/uL (ref 0.0–0.1)
EOS%: 0.1 % (ref 0.0–7.0)
Eosinophils Absolute: 0 10*3/uL (ref 0.0–0.5)
HCT: 34.2 % — ABNORMAL LOW (ref 38.4–49.9)
HEMOGLOBIN: 10.9 g/dL — AB (ref 13.0–17.1)
LYMPH%: 5.3 % — ABNORMAL LOW (ref 14.0–49.0)
MCH: 25.7 pg — AB (ref 27.2–33.4)
MCHC: 31.9 g/dL — AB (ref 32.0–36.0)
MCV: 80.5 fL (ref 79.3–98.0)
MONO#: 0.3 10*3/uL (ref 0.1–0.9)
MONO%: 7.5 % (ref 0.0–14.0)
NEUT#: 3.2 10*3/uL (ref 1.5–6.5)
NEUT%: 87.1 % — ABNORMAL HIGH (ref 39.0–75.0)
Platelets: 339 10*3/uL (ref 140–400)
RBC: 4.25 10*6/uL (ref 4.20–5.82)
RDW: 13 % (ref 11.0–14.6)
WBC: 3.6 10*3/uL — AB (ref 4.0–10.3)
lymph#: 0.2 10*3/uL — ABNORMAL LOW (ref 0.9–3.3)

## 2015-08-14 LAB — COMPREHENSIVE METABOLIC PANEL
ALBUMIN: 2.2 g/dL — AB (ref 3.5–5.0)
ALK PHOS: 107 U/L (ref 40–150)
ALT: 32 U/L (ref 0–55)
AST: 23 U/L (ref 5–34)
Anion Gap: 11 mEq/L (ref 3–11)
BUN: 20.6 mg/dL (ref 7.0–26.0)
CHLORIDE: 100 meq/L (ref 98–109)
CO2: 28 mEq/L (ref 22–29)
Calcium: 9.7 mg/dL (ref 8.4–10.4)
Creatinine: 0.8 mg/dL (ref 0.7–1.3)
GLUCOSE: 142 mg/dL — AB (ref 70–140)
POTASSIUM: 4.3 meq/L (ref 3.5–5.1)
SODIUM: 139 meq/L (ref 136–145)
Total Bilirubin: 0.43 mg/dL (ref 0.20–1.20)
Total Protein: 6.8 g/dL (ref 6.4–8.3)

## 2015-08-14 NOTE — Progress Notes (Signed)
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 male is here because of his abnormal CT scan findings, which is harvest spacers for metastatic malignancy. He is accompanied by his wife and sister to my clinic today.  He had an accident when he was moving a heavy stuff one month ago and he though he pulled his abdominal muscles. He has been having intermittent lower abdominal pain since then, it locates in his low abd , worse when he laugh, cough or after meals, it wakes him up at night sometimes. He also noticed mild chest pain when he coughs or lumps. No dyspnea or productive cough. He denies significant abdomen blating, no nausea or change of BM (except constipation from pain meds), he has lost aboiot 10-15 lbs in the past one month, appetite lower, stopped working one months due to the pain, he takes 2-6 vicodin a day. He was initially seen by urgent care, lab showed hyperglycemia, abnormal liver function. He was referred to gastroenterologist Dr. Ardis Hughs. CT of abdomen and pelvis with contrast was obtained on 07/04/2015, which showed peritoneal carcinomatosis, lytic bone lesions in T9 and T11, no definitive primary malignancy. He underwent EGD and colonoscopy yesterday by Dr. Ardis Hughs, no primary tumor was found.  He has not see her PCP for 5 years, and has scheduled to see PCP next week.   CURRENT THERAPY:  1. He started palliative radiation 07/31/2015 2. First line chemo carboplatin and Alimta starting on April 28, first cycle dose reduction due to RT   INTERIM HISTORY: Sutton returns for  follow-up  After first cycle chemotherapy last week. He tolerated module well overall, had a mild nausea, took Compazine and Zofran. No vomiting. Moderate fatigue from chemotherapy and radiation, he is able to function at home , but not physically active. He notices abdomen  Is more bloated lately,  His appetite is moderate to low, he eats small meals, feels full quickly.  His abdominal pain overall is controlled, he takes OxyContin and oxycodone 3-5 times a day.No other new complains.   likely related to  MEDICAL HISTORY:  Past Medical History  Diagnosis Date  . Diabetes [facility])     SURGICAL HISTORY: Past Surgical History  Procedure Laterality Date  . Spine surgery      cervical fusion  . Tonsillectomy      SOCIAL HISTORY: Social History   Social History  . Marital Status: Married    Spouse Name: N/A  . Number of Children: N/A  . Years of Education: N/A   Occupational History  . Not on file.   Social History Main Topics  . Smoking status: Former Smoker -- 1.00 packs/day for 20 years    Quit date: 04/12/2004  . Smokeless tobacco: Never Used  . Alcohol Use: No     Comment: social drinker   . Drug Use: No  . Sexual Activity: Not on file   Other Topics Concern  . 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 Cancer Other     Lung  . Lung cancer Mother   . Lung cancer Father   . Colon cancer Neg Hx   . Esophageal cancer Neg Hx   . Stomach cancer Neg Hx   . Cancer Paternal Grandmother 67    ovarian cancer     ALLERGIES:  has No Known Allergies.  MEDICATIONS:  Current Outpatient Prescriptions  Medication Sig Dispense Refill  . bisacodyl (DULCOLAX) 5  MG EC tablet Take 5 mg by mouth daily as needed for moderate constipation. Reported on 08/08/2015    . blood glucose meter kit and supplies KIT Dispense based on patient and insurance preference. Use up to four times daily as directed. E11.65 1 each 0  . dexamethasone (DECADRON) 4 MG tablet Take 1 tablet (4 mg total) by mouth 2 (two) times daily. 10 tablet 2  . Docusate Sodium (DULCOLAX STOOL SOFTENER PO) Take by mouth as needed. Reported on 1/60/1093    . folic acid (FOLVITE) 1 MG tablet Take 1 tablet (1 mg total) by mouth daily. 30 tablet 3  . magnesium citrate SOLN Take 1 Bottle by mouth daily as needed for severe constipation.    . metFORMIN (GLUCOPHAGE) 500 MG tablet Take 1 tablet (500 mg total) by mouth 2 (two) times daily with a meal. 60 tablet 11  . ondansetron (ZOFRAN) 4 MG tablet Take 1 tablet (4 mg total) by mouth every 8 (eight) hours as needed for nausea or vomiting. 30 tablet 1  . oxyCODONE (OXY IR/ROXICODONE) 5 MG immediate release tablet Take 1 tablet (5 mg total) by mouth every 4 (four) hours as needed for severe pain. 60 tablet 0  . oxyCODONE (OXYCONTIN) 15 mg 12 hr tablet Take 1 tablet (15 mg total) by mouth every 12 (twelve) hours. 60 tablet 0  . polyethylene glycol powder (MIRALAX) powder Take 1 Container by mouth as needed.    . prochlorperazine (COMPAZINE) 10 MG tablet Take 1 tablet (10 mg total) by mouth every 6 (six) hours as needed for nausea or vomiting. 30 tablet 0  . senna (SENOKOT) 8.6 MG TABS tablet Take 1 tablet by mouth as needed for mild constipation.     No current facility-administered medications for this visit.    REVIEW OF SYSTEMS:   Constitutional: Denies fevers, chills or abnormal night sweats Eyes: Denies blurriness of vision, double vision or watery eyes Ears, nose, mouth, throat, and face: Denies mucositis or sore throat Respiratory: Denies cough, dyspnea or wheezes Cardiovascular: Denies palpitation, chest discomfort or lower extremity  swelling Gastrointestinal:  Denies nausea, heartburn or change in bowel habits Skin: Denies abnormal skin rashes Lymphatics: Denies new lymphadenopathy or easy bruising Neurological:Denies numbness, tingling or new weaknesses Behavioral/Psych: Mood is stable, no new changes  All other systems were reviewed with the patient and are negative.  PHYSICAL EXAMINATION: ECOG PERFORMANCE STATUS: 3  Filed Vitals:   08/14/15 1456  BP: 115/76  Pulse: 107  Temp: 97.6 F (36.4 C)  Resp: 18   Filed Weights   08/14/15 1456  Weight: 174 lb 4.8 oz (79.062 kg)    GENERAL:alert, no distress and comfortable SKIN: skin color, texture, turgor are normal, no rashes or significant lesions EYES: normal, conjunctiva are pink and non-injected, sclera clear OROPHARYNX:no  exudate, no erythema and lips, buccal mucosa, and tongue normal  NECK: supple, thyroid normal size, non-tender, without nodularity LYMPH:  no palpable lymphadenopathy in the cervical, axillary or inguinal LUNGS: clear to auscultation and percussion with normal breathing effort HEART: regular rate & rhythm and no murmurs and no lower extremity edema ABDOMEN:abdomen soft, significantly bloated, diffuse tenderness in abdomen, no organomegaly , no palpable mass, normal bowel sounds Musculoskeletal:no cyanosis of digits and no clubbing  PSYCH: alert & oriented x 3 with fluent speech NEURO: no focal motor/sensory deficits  LABORATORY DATA:  I have reviewed the data as listed CBC Latest Ref Rng 08/14/2015 08/08/2015 07/31/2015  WBC 4.0 - 10.3 10e3/uL 3.6(L) 7.9 9.0  Hemoglobin 13.0 - 17.1 g/dL 10.9(L) 11.7(L) 12.1(L)  Hematocrit 38.4 - 49.9 % 34.2(L) 35.0(L) 36.4(L)  Platelets 140 - 400 10e3/uL 339 453(H) 512(H)    CMP Latest Ref Rng 08/14/2015 08/08/2015 07/31/2015  Glucose 70 - 140 mg/dl 142(H) 150(H) 128  BUN 7.0 - 26.0 mg/dL 20.6 18.4 14.3  Creatinine 0.7 - 1.3 mg/dL 0.8 0.8 0.9  Sodium 136 - 145 mEq/L 139 137 140  Potassium 3.5 - 5.1  mEq/L 4.3 4.9 4.8  Chloride 96 - 112 mEq/L - - -  CO2 22 - 29 mEq/L '28 27 28  ' Calcium 8.4 - 10.4 mg/dL 9.7 9.9 9.9  Total Protein 6.4 - 8.3 g/dL 6.8 6.8 7.2  Total Bilirubin 0.20 - 1.20 mg/dL 0.43 0.40 0.51  Alkaline Phos 40 - 150 U/L 107 87 91  AST 5 - 34 U/L '23 16 16  ' ALT 0 - 55 U/L 32 13 11   Results for KAIYU, MIRABAL (MRN [id number]) as of 07/10/2015 08:31  Ref. Range 07/07/2015 08:51  CA 19-9 Latest Ref Range: <34 U/mL 271 (H)  CEA Latest Units: ng/mL 147.3 (H)  PSA Latest Ref Range: 0.10-4.00 ng/mL 0.87   PATHOLOGY REPORT  Diagnosis 07/15/2015 Omentum, biopsy, RUQ CT - METASTATIC ADENOCARCINOMA. - SEE MICROSCOPIC DESCRIPTION. Microscopic Comment There is adipose tissue which is involved by adenocarcinoma with focal signet ring features. Immunohistochemistry will be performed and reported as an addendum.      RADIOGRAPHIC STUDIES: I have personally reviewed the radiological images as listed and agreed with the findings in the report.  PET 07/11/2015 IMPRESSION: 1. Examination is positive for multifocal hypermetabolic lytic bone metastases. Lesions are identified within the extremities, spine and pelvis. Most concerning is a lytic lesion involving the posterior aspect of the T11 vertebra. If there is a concern for canal involvement then an MRI with contrast material may be helpful for further assessment. 2. Hypermetabolic spiculated nodule within the left upper lobe is identified. This may represent a primary pulmonary malignancy or a focus of metastatic disease. 3. Evidence of peritoneal carcinomatosis with presumed malignant ascites and omental caking.  Brain MRI with and without contrast 08/12/2015 IMPRESSION: 1. 3 mm enhancing left cerebellar lesion, suspicious for solitary metastasis though this could potentially be vascular. No edema. 2. Mild diffuse calvarial and upper cervical bone marrow signal abnormality, nonspecific but may reflect osseous metastatic  disease.   ASSESSMENT & PLAN:  55 year old Caucasian male, without significant past medical history, former smoker with 20 pack years smoking history, presented with abdominal pain, anorexia, weight loss.   1. 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 -His CT and PET scan findings are  highly suspicious for metastatic malignancy. There is no convincing primary tumor. His EGD and colonoscop was negative for primary tumor. -The 9 mm left upper lung nodule is very suspicious for a primary lung cancer, but not very typical as the primary tumor which causes the diffuse metastatic disease due to its small size. It also could be a metastatic lesion.  -His tumor marker showed significant elevated CEA and CA 19.9, which supports for a upper GI primary. -The omental biopsy confirmed metastatic adenocarcinoma, however the IHC studies were not conclusive. CK 7 positive supporting primary lung or upper GI, however the other lung or GI marker were negative. -abdominal MRI was negative for pancreatic or biliary primary. He has no anemia, makes small bowel primary less likely. -We'll review the incurable nature of his disease.  -I discussed the PDL 1 IHC study results, which was negative (0%), he is not a candidate for first-line immunotherapy -I discussed his [facility] one genomic testing results, which showed KRAS mutation, no actionable mutation identified  - He has started  First-line chemotherapy carboplatin and pemetrexed, dose reduced for for cycle due to the concurrent irradiation. - I discussed his brain MRI findings. The 3 mm enhancing lesion in the left cerebellum is suspicious for single metastasis,  But indeterminate. I'll review with our radiologist and Dr. Lisbeth Renshaw, who will decide if he needs SRS to this brain lesion vs  observation   2. Abdominal pain and bloating  -continue OxyContin 15 mg twice daily, and oxycodone as needed, his pain overall is controlled -He has paracentesis with 5 L fluid is removed on April 26,  And I'll schedule a repeated paracentesis for tomorrow  3. 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   3. Anorexia and weight loss -I encouraged him to try nutritional supplement, such as  Glucerna   -NUTRITION consult  4. Newly diagnosed diabetes -He will follow-up with his primary care physician, he has recently started metformin -I encouraged him to monitor his blood work was at home  Plan for today -paracentesis by interventional radiology tomorrow at 2:00 - he is scheduled to return in 2 weeks for second cycle chemotherapy, restaging CT scans before cycle 3 chemo   All questions were answered. The patient knows to call the clinic with any problems, questions or concerns. I spent 15 minutes counseling the patient face to face. The total time spent in the appointment was 20 minutes and more than 50% was on counseling.     Truitt Merle, MD 08/14/2015 3:35 PM

## 2015-08-15 ENCOUNTER — Ambulatory Visit: Payer: Commercial Managed Care - PPO

## 2015-08-15 ENCOUNTER — Ambulatory Visit
Admission: RE | Admit: 2015-08-15 | Discharge: 2015-08-15 | Disposition: A | Discharge status: Home / Self Care | Payer: Commercial Managed Care - PPO | Source: Ambulatory Visit | Attending: Radiation Oncology | Admitting: Radiation Oncology

## 2015-08-15 ENCOUNTER — Encounter: Payer: Self-pay | Admitting: Radiation Oncology

## 2015-08-15 ENCOUNTER — Ambulatory Visit (HOSPITAL_COMMUNITY)
Admission: RE | Admit: 2015-08-15 | Discharge: 2015-08-15 | Disposition: A | Discharge status: Home / Self Care | Payer: Commercial Managed Care - PPO | Source: Ambulatory Visit | Attending: Hematology | Admitting: Hematology

## 2015-08-15 VITALS — BP 121/69 | HR 98 | Temp 97.5°F | Resp 20 | Wt 162.3 lb

## 2015-08-15 DIAGNOSIS — C801 Malignant (primary) neoplasm, unspecified: Secondary | ICD-10-CM | POA: Insufficient documentation

## 2015-08-15 DIAGNOSIS — R188 Other ascites: Secondary | ICD-10-CM | POA: Insufficient documentation

## 2015-08-15 DIAGNOSIS — C799 Secondary malignant neoplasm of unspecified site: Secondary | ICD-10-CM | POA: Insufficient documentation

## 2015-08-15 DIAGNOSIS — C7951 Secondary malignant neoplasm of bone: Secondary | ICD-10-CM

## 2015-08-15 DIAGNOSIS — K5900 Constipation, unspecified: Secondary | ICD-10-CM

## 2015-08-15 DIAGNOSIS — Z510 Encounter for antineoplastic radiation therapy: Secondary | ICD-10-CM | POA: Diagnosis not present

## 2015-08-15 MED ORDER — LACTULOSE 10 GM/15ML PO SOLN: 10.0000 g | Freq: Two times a day (BID) | ORAL | Status: DC

## 2015-08-15 NOTE — Progress Notes (Signed)
Department of Radiation Oncology  Phone:  [phone] Fax:        [phone]  Weekly Treatment Note    Name: Jonathon Cline Date: 08/15/2015 MRN: [id number] DOB: 1960-08-01   Diagnosis:     ICD-9-CM ICD-10-CM   1. Constipation, unspecified constipation type 564.00 K59.00 lactulose (CHRONULAC) 10 GM/15ML solution  2. Osseous metastasis (HCC) 198.5 C79.51      Current dose: 27.5 Gy  Current fraction: 11   MEDICATIONS: Current Outpatient Prescriptions  Medication Sig Dispense Refill  . bisacodyl (DULCOLAX) 5 MG EC tablet Take 5 mg by mouth daily as needed for moderate constipation. Reported on 08/08/2015    . blood glucose meter kit and supplies KIT Dispense based on patient and insurance preference. Use up to four times daily as directed. E11.65 1 each 0  . Docusate Sodium (DULCOLAX STOOL SOFTENER PO) Take by mouth as needed. Reported on 8/89/1694    . folic acid (FOLVITE) 1 MG tablet Take 1 tablet (1 mg total) by mouth daily. 30 tablet 3  . magnesium citrate SOLN Take 1 Bottle by mouth daily as needed for severe constipation.    . metFORMIN (GLUCOPHAGE) 500 MG tablet Take 1 tablet (500 mg total) by mouth 2 (two) times daily with a meal. 60 tablet 11  . ondansetron (ZOFRAN) 4 MG tablet Take 1 tablet (4 mg total) by mouth every 8 (eight) hours as needed for nausea or vomiting. 30 tablet 1  . oxyCODONE (OXY IR/ROXICODONE) 5 MG immediate release tablet Take 1 tablet (5 mg total) by mouth every 4 (four) hours as needed for severe pain. 60 tablet 0  . oxyCODONE (OXYCONTIN) 15 mg 12 hr tablet Take 1 tablet (15 mg total) by mouth every 12 (twelve) hours. 60 tablet 0  . prochlorperazine (COMPAZINE) 10 MG tablet Take 1 tablet (10 mg total) by mouth every 6 (six) hours as needed for nausea or vomiting. 30 tablet 0  . senna (SENOKOT) 8.6 MG TABS tablet Take 1 tablet by mouth as needed for mild constipation.    Marland Kitchen dexamethasone (DECADRON) 4 MG tablet Take 1 tablet (4 mg total) by mouth 2  (two) times daily. (Patient not taking: Reported on 08/15/2015) 10 tablet 2  . lactulose (CHRONULAC) 10 GM/15ML solution Take 15 mLs (10 g total) by mouth 2 (two) times daily. 240 mL 1  . polyethylene glycol powder (MIRALAX) powder Take 1 Container by mouth as needed. Reported on 08/15/2015     No current facility-administered medications for this encounter.     ALLERGIES: Review of patient's allergies indicates no known allergies.   LABORATORY DATA:  Lab Results  Component Value Date   WBC 3.6* 08/14/2015   HGB 10.9* 08/14/2015   HCT 34.2* 08/14/2015   MCV 80.5 08/14/2015   PLT 339 08/14/2015   Lab Results  Component Value Date   NA 139 08/14/2015   K 4.3 08/14/2015   CL 98 07/04/2015   CO2 28 08/14/2015   Lab Results  Component Value Date   ALT 32 08/14/2015   AST 23 08/14/2015   ALKPHOS 107 08/14/2015   BILITOT 0.43 08/14/2015     NARRATIVE: Jonathon Cline was seen today for weekly treatment management. The chart was checked and the patient's films were reviewed.  Weekly rad txs left rib, T8-12 and right hip 11/15 completd, had 4.5 liters  Drawn from paracentesis today, 5 liters drawn off last week,  Next chemo 08/29/15, poor appetite, c/o abdominal cramping, fatigued,  6:48 PM  BP 121/69 mmHg  Pulse 98  Temp(Src) 97.5 F (36.4 C) (Oral)  Resp 20  Wt 162 lb 4.8 oz (73.619 kg)  Wt Readings from Last 3 Encounters:  08/15/15 162 lb 4.8 oz (73.619 kg)  08/14/15 174 lb 4.8 oz (79.062 kg)  08/12/15 173 lb (78.472 kg)    PHYSICAL EXAMINATION: weight is 162 lb 4.8 oz (73.619 kg). His oral temperature is 97.5 F (36.4 C). His blood pressure is 121/69 and his pulse is 98. His respiration is 20.        ASSESSMENT: The patient is doing satisfactorily with treatment. The patient is predominantly complaining of abdominal pain at this time. As noted above, the patient had additional fluid drawn off today.  PLAN: We will continue with the patient's radiation treatment as  planned.

## 2015-08-15 NOTE — Progress Notes (Addendum)
Weekly rad txs left rib, T8-12 and right hip 11/15 completd, had 4.5 liters  Drawn from paracentesis today, 5 liters drawn off last week,  Next chemo 08/29/15, poor appetite, c/o abdominal cramping, fatigued,  4:34 PM BP 121/69 mmHg  Pulse 98  Temp(Src) 97.5 F (36.4 C) (Oral)  Resp 20  Wt 162 lb 4.8 oz (73.619 kg)  Wt Readings from Last 3 Encounters:  08/15/15 162 lb 4.8 oz (73.619 kg)  08/14/15 174 lb 4.8 oz (79.062 kg)  08/12/15 173 lb (78.472 kg)

## 2015-08-15 NOTE — Procedures (Signed)
Ultrasound-guided  therapeutic paracentesis performed yielding 4.5 liters of hazy, amber fluid. No immediate complications.

## 2015-08-18 ENCOUNTER — Ambulatory Visit: Payer: Commercial Managed Care - PPO

## 2015-08-18 ENCOUNTER — Ambulatory Visit
Admission: RE | Admit: 2015-08-18 | Discharge: 2015-08-18 | Disposition: A | Discharge status: Home / Self Care | Payer: Commercial Managed Care - PPO | Source: Ambulatory Visit | Attending: Radiation Oncology | Admitting: Radiation Oncology

## 2015-08-18 ENCOUNTER — Ambulatory Visit: Admission: RE | Admit: 2015-08-18 | Payer: Commercial Managed Care - PPO | Source: Ambulatory Visit

## 2015-08-18 DIAGNOSIS — Z510 Encounter for antineoplastic radiation therapy: Secondary | ICD-10-CM | POA: Diagnosis not present

## 2015-08-19 ENCOUNTER — Ambulatory Visit
Admission: RE | Admit: 2015-08-19 | Discharge: 2015-08-19 | Disposition: A | Discharge status: Home / Self Care | Payer: Commercial Managed Care - PPO | Source: Ambulatory Visit | Attending: Radiation Oncology | Admitting: Radiation Oncology

## 2015-08-19 ENCOUNTER — Ambulatory Visit: Payer: Commercial Managed Care - PPO

## 2015-08-19 DIAGNOSIS — Z510 Encounter for antineoplastic radiation therapy: Secondary | ICD-10-CM | POA: Diagnosis not present

## 2015-08-20 ENCOUNTER — Ambulatory Visit: Payer: Commercial Managed Care - PPO

## 2015-08-20 ENCOUNTER — Ambulatory Visit
Admission: RE | Admit: 2015-08-20 | Discharge: 2015-08-20 | Disposition: A | Discharge status: Home / Self Care | Payer: Commercial Managed Care - PPO | Source: Ambulatory Visit | Attending: Radiation Oncology | Admitting: Radiation Oncology

## 2015-08-20 DIAGNOSIS — Z510 Encounter for antineoplastic radiation therapy: Secondary | ICD-10-CM | POA: Diagnosis not present

## 2015-08-21 ENCOUNTER — Ambulatory Visit: Payer: Commercial Managed Care - PPO

## 2015-08-21 ENCOUNTER — Ambulatory Visit
Admission: RE | Admit: 2015-08-21 | Discharge: 2015-08-21 | Disposition: A | Discharge status: Home / Self Care | Payer: Commercial Managed Care - PPO | Source: Ambulatory Visit | Attending: Radiation Oncology | Admitting: Radiation Oncology

## 2015-08-21 ENCOUNTER — Encounter: Payer: Self-pay | Admitting: Radiation Oncology

## 2015-08-21 DIAGNOSIS — Z510 Encounter for antineoplastic radiation therapy: Secondary | ICD-10-CM | POA: Diagnosis not present

## 2015-08-25 ENCOUNTER — Telehealth: Payer: Self-pay | Admitting: *Deleted

## 2015-08-25 ENCOUNTER — Other Ambulatory Visit: Payer: Self-pay | Admitting: *Deleted

## 2015-08-25 ENCOUNTER — Telehealth: Payer: Self-pay | Admitting: Nurse Practitioner

## 2015-08-25 DIAGNOSIS — C7951 Secondary malignant neoplasm of bone: Secondary | ICD-10-CM

## 2015-08-25 DIAGNOSIS — C799 Secondary malignant neoplasm of unspecified site: Secondary | ICD-10-CM

## 2015-08-25 NOTE — Telephone Encounter (Signed)
Received call from wife requesting a call back from md or nurse.   Spoke with wife Opal Sidles and was informed that pt was feeling bloated with tightness in abdomen since Friday late afternoon.  Opal Sidles wanted to know if pt could have paracentesis done.  Opal Sidles also stated pt had not had appetite since the weekend.  Stated every time pt tried to eat, he vomited.  Pt only able to keep fluids down, and antiemetics not helping much per wife.    Pt's sister - who is a Marine scientist - suggested that pt will need  J - tube  Per Opal Sidles.  Pt told wife that  " it is burning every time he eats " but unable to specify whether burning in esophagus or abdomen. Dr. Burr Medico notified. [facility]    Phone      [phone].

## 2015-08-25 NOTE — Telephone Encounter (Signed)
pt called and wanted to cx 5/16 apts.. said he would resched in a day or two.

## 2015-08-25 NOTE — Telephone Encounter (Signed)
Spoke with wife Opal Sidles and gave her appts for pt to see symptom management provider at 11 am  08/26/15.  Pt is scheduled for paracentesis at 1 pm 08/26/15 after office visit.  Opal Sidles voiced understanding. POF sent to scheduler.

## 2015-08-26 ENCOUNTER — Encounter: Payer: Commercial Managed Care - PPO | Admitting: Nurse Practitioner

## 2015-08-26 ENCOUNTER — Other Ambulatory Visit: Payer: Commercial Managed Care - PPO

## 2015-08-27 ENCOUNTER — Telehealth: Payer: Self-pay | Admitting: Hematology

## 2015-08-27 ENCOUNTER — Telehealth: Payer: Self-pay | Admitting: *Deleted

## 2015-08-27 ENCOUNTER — Other Ambulatory Visit: Payer: Self-pay | Admitting: *Deleted

## 2015-08-27 DIAGNOSIS — C799 Secondary malignant neoplasm of unspecified site: Secondary | ICD-10-CM

## 2015-08-27 MED ORDER — OXYCODONE HCL 5 MG PO TABS: 5.0000 mg | ORAL_TABLET | ORAL | Status: DC | PRN

## 2015-08-27 NOTE — Telephone Encounter (Signed)
Oncology Nurse Navigator Documentation  Oncology Nurse Navigator Flowsheets 08/27/2015  Navigator Location [facility]  Navigator Encounter Type Telephone  Telephone Outgoing Call  Treatment Phase Treatment  Barriers/Navigation Needs No barriers at this time  Acuity Level 1  Time Spent with Patient 15   Per GI navigator, I called to introduce myself to new lung cancer patient.   I left my name and phone number to call if needed.

## 2015-08-27 NOTE — Telephone Encounter (Signed)
per pof to sch pt appt-sent MW email to r/s pt trmt to coordinate w/MD appt

## 2015-08-27 NOTE — Telephone Encounter (Signed)
Wife left VM that Jonathon Cline is not up to being treated this Friday and asking to move his office visit and chemo to early next week. Please call. Forwarded message to collaborative nurse.

## 2015-08-27 NOTE — Telephone Encounter (Signed)
Received message that pt wants to cancel Friday treatment d/t not feeling up to it & wants to move appts to early next week.  Called & spoke with wife & she reports that pt feels weak, fatigued & no energy.  She states that he had trouble eating over the weekend but is eating now & having BM;s.  He is scheduled for paracentesis tomorrow.  She doesn't think he needs IV fluids.  She thinks a treatment on Friday will just knock him down again. She also needs oxycodone refill.  He only has 10 left & is taking about 3 /d.  She will p/u tomorrow when here for paracentesis. POF to scheduler to r/s appts.

## 2015-08-28 ENCOUNTER — Telehealth: Payer: Self-pay | Admitting: *Deleted

## 2015-08-28 ENCOUNTER — Telehealth: Payer: Self-pay | Admitting: Oncology

## 2015-08-28 ENCOUNTER — Ambulatory Visit (HOSPITAL_COMMUNITY)
Admission: RE | Admit: 2015-08-28 | Discharge: 2015-08-28 | Disposition: A | Discharge status: Home / Self Care | Payer: Commercial Managed Care - PPO | Source: Ambulatory Visit | Attending: Hematology | Admitting: Hematology

## 2015-08-28 DIAGNOSIS — C7951 Secondary malignant neoplasm of bone: Secondary | ICD-10-CM | POA: Diagnosis not present

## 2015-08-28 DIAGNOSIS — C801 Malignant (primary) neoplasm, unspecified: Secondary | ICD-10-CM | POA: Diagnosis not present

## 2015-08-28 DIAGNOSIS — R188 Other ascites: Secondary | ICD-10-CM | POA: Diagnosis not present

## 2015-08-28 DIAGNOSIS — C799 Secondary malignant neoplasm of unspecified site: Secondary | ICD-10-CM

## 2015-08-28 NOTE — Telephone Encounter (Signed)
Per staff message and POF I have scheduled appts. Advised scheduler of appts. JMW  

## 2015-08-28 NOTE — Telephone Encounter (Signed)
cld pt and lef t a message of time & date of appt for 5/24@9 :59

## 2015-08-28 NOTE — Procedures (Signed)
Ultrasound-guided therapeutic paracentesis performed yielding 1.7 liters of clear yellow colored fluid. No immediate complications.  Jonathon Cline E 11:23 AM 08/28/2015

## 2015-08-29 ENCOUNTER — Ambulatory Visit: Payer: Commercial Managed Care - PPO

## 2015-08-29 ENCOUNTER — Other Ambulatory Visit: Payer: Commercial Managed Care - PPO

## 2015-08-29 ENCOUNTER — Ambulatory Visit: Payer: Commercial Managed Care - PPO | Admitting: Nurse Practitioner

## 2015-09-03 ENCOUNTER — Ambulatory Visit (HOSPITAL_BASED_OUTPATIENT_CLINIC_OR_DEPARTMENT_OTHER): Payer: Commercial Managed Care - PPO | Admitting: Nurse Practitioner

## 2015-09-03 ENCOUNTER — Other Ambulatory Visit: Payer: Self-pay | Admitting: Nurse Practitioner

## 2015-09-03 ENCOUNTER — Other Ambulatory Visit (HOSPITAL_BASED_OUTPATIENT_CLINIC_OR_DEPARTMENT_OTHER): Payer: Commercial Managed Care - PPO

## 2015-09-03 ENCOUNTER — Telehealth: Payer: Self-pay | Admitting: Hematology

## 2015-09-03 ENCOUNTER — Encounter: Payer: Self-pay | Admitting: *Deleted

## 2015-09-03 ENCOUNTER — Ambulatory Visit (HOSPITAL_BASED_OUTPATIENT_CLINIC_OR_DEPARTMENT_OTHER): Payer: Commercial Managed Care - PPO

## 2015-09-03 VITALS — BP 114/79 | HR 106 | Temp 98.5°F | Resp 17 | Ht 70.0 in | Wt 157.3 lb

## 2015-09-03 DIAGNOSIS — C7951 Secondary malignant neoplasm of bone: Secondary | ICD-10-CM

## 2015-09-03 DIAGNOSIS — C799 Secondary malignant neoplasm of unspecified site: Secondary | ICD-10-CM

## 2015-09-03 DIAGNOSIS — C7800 Secondary malignant neoplasm of unspecified lung: Secondary | ICD-10-CM | POA: Diagnosis not present

## 2015-09-03 DIAGNOSIS — R112 Nausea with vomiting, unspecified: Secondary | ICD-10-CM

## 2015-09-03 DIAGNOSIS — C786 Secondary malignant neoplasm of retroperitoneum and peritoneum: Secondary | ICD-10-CM

## 2015-09-03 DIAGNOSIS — C801 Malignant (primary) neoplasm, unspecified: Secondary | ICD-10-CM

## 2015-09-03 DIAGNOSIS — K219 Gastro-esophageal reflux disease without esophagitis: Secondary | ICD-10-CM | POA: Diagnosis not present

## 2015-09-03 DIAGNOSIS — R1111 Vomiting without nausea: Secondary | ICD-10-CM

## 2015-09-03 LAB — CBC WITH DIFFERENTIAL/PLATELET
BASO%: 0.2 % (ref 0.0–2.0)
BASOS ABS: 0 10*3/uL (ref 0.0–0.1)
EOS%: 0 % (ref 0.0–7.0)
Eosinophils Absolute: 0 10*3/uL (ref 0.0–0.5)
HEMATOCRIT: 29.9 % — AB (ref 38.4–49.9)
HGB: 9.8 g/dL — ABNORMAL LOW (ref 13.0–17.1)
LYMPH#: 0.2 10*3/uL — AB (ref 0.9–3.3)
LYMPH%: 4.1 % — AB (ref 14.0–49.0)
MCH: 25.7 pg — ABNORMAL LOW (ref 27.2–33.4)
MCHC: 32.7 g/dL (ref 32.0–36.0)
MCV: 78.6 fL — ABNORMAL LOW (ref 79.3–98.0)
MONO#: 0.5 10*3/uL (ref 0.1–0.9)
MONO%: 10.4 % (ref 0.0–14.0)
NEUT#: 4.4 10*3/uL (ref 1.5–6.5)
NEUT%: 85.3 % — AB (ref 39.0–75.0)
Platelets: 439 10*3/uL — ABNORMAL HIGH (ref 140–400)
RBC: 3.8 10*6/uL — ABNORMAL LOW (ref 4.20–5.82)
RDW: 15.2 % — ABNORMAL HIGH (ref 11.0–14.6)
WBC: 5.2 10*3/uL (ref 4.0–10.3)

## 2015-09-03 LAB — COMPREHENSIVE METABOLIC PANEL
ALT: 10 U/L (ref 0–55)
ANION GAP: 12 meq/L — AB (ref 3–11)
AST: 13 U/L (ref 5–34)
Albumin: 2.1 g/dL — ABNORMAL LOW (ref 3.5–5.0)
Alkaline Phosphatase: 103 U/L (ref 40–150)
BUN: 13.3 mg/dL (ref 7.0–26.0)
CALCIUM: 9.5 mg/dL (ref 8.4–10.4)
CHLORIDE: 91 meq/L — AB (ref 98–109)
CO2: 31 meq/L — AB (ref 22–29)
CREATININE: 0.7 mg/dL (ref 0.7–1.3)
EGFR: >90 mL/min/{1.73_m2} (ref >90)
Glucose: 174 mg/dl — ABNORMAL HIGH (ref 70–140)
POTASSIUM: 3.7 meq/L (ref 3.5–5.1)
Sodium: 135 mEq/L — ABNORMAL LOW (ref 136–145)
Total Bilirubin: 0.5 mg/dL (ref 0.20–1.20)
Total Protein: 6.6 g/dL (ref 6.4–8.3)

## 2015-09-03 MED ORDER — FENTANYL 12 MCG/HR TD PT72: 12.5000 ug | MEDICATED_PATCH | TRANSDERMAL | Status: DC

## 2015-09-03 MED ORDER — PANTOPRAZOLE SODIUM 40 MG PO TBEC: 40.0000 mg | DELAYED_RELEASE_TABLET | Freq: Every day | ORAL | Status: AC

## 2015-09-03 MED ORDER — PROCHLORPERAZINE EDISYLATE 5 MG/ML IJ SOLN
INTRAMUSCULAR | Status: AC
  Filled 2015-09-03: qty 2

## 2015-09-03 MED ORDER — SODIUM CHLORIDE 0.9 % IV SOLN
Freq: Once | INTRAVENOUS | Status: AC
  Administered 2015-09-03: 12:00:00 via INTRAVENOUS

## 2015-09-03 MED ORDER — SUCRALFATE 1 GM/10ML PO SUSP: 1.0000 g | Freq: Three times a day (TID) | ORAL | Status: AC

## 2015-09-03 MED ORDER — PROCHLORPERAZINE EDISYLATE 5 MG/ML IJ SOLN
5.0000 mg | Freq: Once | INTRAMUSCULAR | Status: AC
  Administered 2015-09-03: 5 mg via INTRAVENOUS

## 2015-09-03 MED ORDER — OXYCODONE HCL 5 MG/5ML PO SOLN: 5.0000 mg | ORAL | Status: AC | PRN

## 2015-09-03 NOTE — Progress Notes (Addendum)
  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 Chronister returns as scheduled. He completed cycle 1 carboplatin/Alimta 08/08/2015. He completed radiation to T8-T12 and right hip on 08/21/2015. For the past 2 weeks he has been experiencing nausea/vomiting. He notes no improvement with Zofran. He is tolerating liquids but not solids. No dysphagia. No odynophagia. He does feel like he is having "reflux" symptoms. He feels weak. He is losing weight. Bowels are moving. No urinary symptoms. No fever. He feels overall his back pain is better. He is currently taking OxyContin with oxycodone as needed. He wonders about switching to a pain patch due to the nausea.  Objective:  Vital signs in last 24 hours:  Blood pressure 114/79, pulse 106, temperature 98.5 F (36.9 C), temperature source Oral, resp. rate 17, height 5\' 10"  (1.778 m), weight 157 lb 4.8 oz (71.351 kg), SpO2 98 %.    HEENT: No thrush or ulcers. Resp: Lungs clear bilaterally. Cardio: Regular rate and rhythm. GI: Abdomen distended. Vascular: Trace pitting edema at the ankles bilaterally. Calves soft and nontender. Neuro: Alert and oriented.  Skin: Normal skin turgor.    Lab Results:  Lab Results  Component Value Date   WBC 5.2 09/03/2015   HGB 9.8* 09/03/2015   HCT 29.9* 09/03/2015   MCV 78.6* 09/03/2015   PLT 439* 09/03/2015   NEUTROABS 4.4 09/03/2015    Imaging:  No results found.  Medications: I have reviewed the patient's current medications.  Assessment/Plan: 1. 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 2. Nausea/vomiting, "reflux" symptoms. Question esophagitis related to radiation. 3. Pain secondary to #1 currently taking  OxyContin/oxycodone. 4. Weight loss.   Disposition: Mr. Billadeau has completed 1 cycle of carboplatin/Alimta. He completed a course of palliative radiation to T8-T12 and the right hip 07/31/2015 through 08/21/2015.  We are holding today's chemotherapy and rescheduling to next week.  He is experiencing nausea/vomiting and reflux. No significant dysphagia or odynophagia. Question if he may have esophagitis related to radiation. He will begin Protonix and Carafate. He is having difficulty tolerating his medications due to the nausea. He will discontinue OxyContin and begin a Duragesic patch 12 g every 3 days. We also change the oxycodone to liquid form 5 mg per 5 mL every 4 hours as needed. We will ask the dietitian to see him regarding nutritional supplements.  He will return for a follow-up visit and possible chemotherapy next week. He will contact the office in the interim with any problems.  Patient seen with Dr. Burr Medico. 30 minutes were spent face-to-face at today's visit with the majority of that time involved in counseling/coordination of care.    Ned Card ANP/GNP-BC   09/03/2015  10:43 AM

## 2015-09-03 NOTE — Progress Notes (Signed)
Patient requesteing to speak with nutritionist, concerned about weight loss and loss of appetite. Called and left message with Ernestene Kiel

## 2015-09-03 NOTE — Telephone Encounter (Signed)
per pof to sch pt appt-sent MW email to sch trmt-pt will get update on MY CHART

## 2015-09-03 NOTE — Progress Notes (Signed)
Oncology Nurse Navigator Documentation  Oncology Nurse Navigator Flowsheets 09/03/2015  Navigator Encounter Type Clinic/MDC  Treatment Phase Treatment  Barriers/Navigation Needs No barriers at this time  Interventions None required  Acuity Level 1  Time Spent with Patient 37   Spoke with patient and wife today at [facility].  He is a newly dx lung cancer.  I introduced myself and gave them my card to call if needed.

## 2015-09-04 ENCOUNTER — Telehealth: Payer: Self-pay | Admitting: *Deleted

## 2015-09-04 NOTE — Telephone Encounter (Signed)
Per staff message and POF I have scheduled appts. Advised scheduler of appts. JMW  

## 2015-09-05 ENCOUNTER — Other Ambulatory Visit: Payer: Self-pay | Admitting: *Deleted

## 2015-09-05 ENCOUNTER — Telehealth: Payer: Self-pay | Admitting: *Deleted

## 2015-09-05 DIAGNOSIS — C799 Secondary malignant neoplasm of unspecified site: Secondary | ICD-10-CM

## 2015-09-05 DIAGNOSIS — C7951 Secondary malignant neoplasm of bone: Secondary | ICD-10-CM

## 2015-09-05 MED ORDER — OXYCODONE HCL ER 15 MG PO T12A: 15.0000 mg | EXTENDED_RELEASE_TABLET | Freq: Two times a day (BID) | ORAL | Status: AC

## 2015-09-05 MED ORDER — OXYCODONE HCL ER 15 MG PO T12A: 15.0000 mg | EXTENDED_RELEASE_TABLET | Freq: Two times a day (BID) | ORAL | Status: DC

## 2015-09-05 NOTE — Telephone Encounter (Signed)
Spoke with wife Jan and was informed that she would like for pt to retake Oxycontin 12 hr again, and Oxycodone solution for breakthrough pain.   Offered MS Contin choice for pt, but Jan declined.  Stated Oxycontin works better for pt. Asked if nurse should discuss with pt;  Jan stated " No.  I know what works best for him ".   Informed Jan that message will be relayed to Dr. Burr Medico.  Nurse will call wife back when script is ready for pick up. Jan voiced understanding.

## 2015-09-05 NOTE — Telephone Encounter (Signed)
Voicemail: "Wife Jonathon Cline calling because Jovany had a bad reaction last night to the fentanyl.  Does he need to go back on the oxycodone or is there something else." Called Jonathon Cline [phone]) who reports "patch was placed Wednesday night and last night he sounded like he was choking.  I went to check on him.  He was violently sick throwing up all over the place.  I took the patch off.  Today he is better.  He has some of the oxycodone left but will need a refill if he is to continue.  Add Fentanyl to his allergies because he cannot use this."  Updated allergies with this call.

## 2015-09-05 NOTE — Telephone Encounter (Signed)
Informed wife Jan that script for Oxycontin is ready for pick up today.

## 2015-09-09 ENCOUNTER — Other Ambulatory Visit: Payer: Self-pay | Admitting: Radiation Therapy

## 2015-09-09 ENCOUNTER — Telehealth: Payer: Self-pay | Admitting: *Deleted

## 2015-09-09 ENCOUNTER — Other Ambulatory Visit: Payer: Self-pay | Admitting: *Deleted

## 2015-09-09 DIAGNOSIS — C799 Secondary malignant neoplasm of unspecified site: Secondary | ICD-10-CM

## 2015-09-09 NOTE — Telephone Encounter (Signed)
Wife called back & states that husband doesn't want to come in today & to leave appts as per relayed message from [place].

## 2015-09-09 NOTE — Telephone Encounter (Signed)
Called & spoke to wife & she has to leave work & go get pt but will try to get here by 3 pm.  POF done.

## 2015-09-09 NOTE — Telephone Encounter (Signed)
Spouse called.  "Please cancel his chemotherapy.  Schedule him to see the doctor sooner.  Jonathon Cline is weak, nauseated and vomiting.  He's going down fast and we need to discuss how to get some nourishment in him.  He vomits when he does eat.  He can't tolerate the supplements either.  Please call me because his phone is off because he doesn't answer it anymore.  My number is [phone]."

## 2015-09-09 NOTE — Telephone Encounter (Signed)
Myrtle, please call her back to see if they cone in late today to see me and Barb.   Krista Blue

## 2015-09-09 NOTE — Telephone Encounter (Signed)
"  Please leave everything scheduled as is for Friday.  He does not feel up to coming in today."  Collaborative notified.

## 2015-09-12 ENCOUNTER — Ambulatory Visit: Payer: Commercial Managed Care - PPO

## 2015-09-12 ENCOUNTER — Other Ambulatory Visit: Payer: Commercial Managed Care - PPO

## 2015-09-12 ENCOUNTER — Telehealth: Payer: Self-pay | Admitting: Hematology

## 2015-09-12 ENCOUNTER — Encounter: Payer: Self-pay | Admitting: Hematology

## 2015-09-12 ENCOUNTER — Encounter: Payer: Commercial Managed Care - PPO | Admitting: Hematology

## 2015-09-12 NOTE — Progress Notes (Signed)
This encounter was created in error - please disregard.

## 2015-09-12 NOTE — Telephone Encounter (Signed)
Pt's wife Thayer Headings called yesterday to cancel his appointment today.   I called Thayer Headings back today after clinic. Jonathon Cline is not doing well, with abdominal pain, nausea, fatigue and anorexia. He has not eat much, able to drink some fluids. I think most of his symptoms are related to his cancer. Giving the rapid deterioration, his prognosis is extremely poor. I think hospice is appropriate. I discussed with Thayer Headings, she was tearful, but agrees with hospice. I'll make a referral as soon as possible. I encouraged him to take the pain medication, and keep hydration and nutrition supplement. She knows to call us if she has any questions.  Truitt Merle  09/12/2015

## 2015-09-13 ENCOUNTER — Inpatient Hospital Stay (HOSPITAL_COMMUNITY)
Admission: EM | Admit: 2015-09-13 | Discharge: 2015-10-11 | DRG: 194 | Disposition: E | Payer: Commercial Managed Care - PPO | Attending: Internal Medicine | Admitting: Internal Medicine

## 2015-09-13 ENCOUNTER — Emergency Department (HOSPITAL_COMMUNITY): Payer: Commercial Managed Care - PPO

## 2015-09-13 ENCOUNTER — Encounter (HOSPITAL_COMMUNITY): Payer: Self-pay | Admitting: Emergency Medicine

## 2015-09-13 DIAGNOSIS — Z87891 Personal history of nicotine dependence: Secondary | ICD-10-CM

## 2015-09-13 DIAGNOSIS — J189 Pneumonia, unspecified organism: Principal | ICD-10-CM | POA: Diagnosis present

## 2015-09-13 DIAGNOSIS — C801 Malignant (primary) neoplasm, unspecified: Secondary | ICD-10-CM | POA: Diagnosis present

## 2015-09-13 DIAGNOSIS — D649 Anemia, unspecified: Secondary | ICD-10-CM | POA: Diagnosis not present

## 2015-09-13 DIAGNOSIS — Z66 Do not resuscitate: Secondary | ICD-10-CM | POA: Diagnosis present

## 2015-09-13 DIAGNOSIS — R112 Nausea with vomiting, unspecified: Secondary | ICD-10-CM | POA: Diagnosis present

## 2015-09-13 DIAGNOSIS — C7951 Secondary malignant neoplasm of bone: Secondary | ICD-10-CM | POA: Diagnosis present

## 2015-09-13 DIAGNOSIS — C786 Secondary malignant neoplasm of retroperitoneum and peritoneum: Secondary | ICD-10-CM | POA: Diagnosis present

## 2015-09-13 DIAGNOSIS — Z7984 Long term (current) use of oral hypoglycemic drugs: Secondary | ICD-10-CM

## 2015-09-13 DIAGNOSIS — G893 Neoplasm related pain (acute) (chronic): Secondary | ICD-10-CM | POA: Diagnosis present

## 2015-09-13 DIAGNOSIS — C799 Secondary malignant neoplasm of unspecified site: Secondary | ICD-10-CM | POA: Diagnosis present

## 2015-09-13 DIAGNOSIS — D630 Anemia in neoplastic disease: Secondary | ICD-10-CM | POA: Diagnosis present

## 2015-09-13 DIAGNOSIS — E860 Dehydration: Secondary | ICD-10-CM | POA: Diagnosis not present

## 2015-09-13 DIAGNOSIS — Z888 Allergy status to other drugs, medicaments and biological substances status: Secondary | ICD-10-CM

## 2015-09-13 DIAGNOSIS — E119 Type 2 diabetes mellitus without complications: Secondary | ICD-10-CM | POA: Diagnosis present

## 2015-09-13 DIAGNOSIS — Z801 Family history of malignant neoplasm of trachea, bronchus and lung: Secondary | ICD-10-CM

## 2015-09-13 DIAGNOSIS — C7800 Secondary malignant neoplasm of unspecified lung: Secondary | ICD-10-CM | POA: Diagnosis present

## 2015-09-13 DIAGNOSIS — Z79891 Long term (current) use of opiate analgesic: Secondary | ICD-10-CM

## 2015-09-13 DIAGNOSIS — Z7952 Long term (current) use of systemic steroids: Secondary | ICD-10-CM

## 2015-09-13 DIAGNOSIS — Z7189 Other specified counseling: Secondary | ICD-10-CM | POA: Insufficient documentation

## 2015-09-13 DIAGNOSIS — R188 Other ascites: Secondary | ICD-10-CM | POA: Diagnosis present

## 2015-09-13 DIAGNOSIS — Z515 Encounter for palliative care: Secondary | ICD-10-CM | POA: Insufficient documentation

## 2015-09-13 DIAGNOSIS — Z79899 Other long term (current) drug therapy: Secondary | ICD-10-CM

## 2015-09-13 DIAGNOSIS — R1110 Vomiting, unspecified: Secondary | ICD-10-CM | POA: Diagnosis not present

## 2015-09-13 LAB — COMPREHENSIVE METABOLIC PANEL
ALBUMIN: 2.5 g/dL — AB (ref 3.5–5.0)
ALK PHOS: 129 U/L — AB (ref 38–126)
ALT: 15 U/L — ABNORMAL LOW (ref 17–63)
AST: 23 U/L (ref 15–41)
Anion gap: 15 (ref 5–15)
BILIRUBIN TOTAL: 0.9 mg/dL (ref 0.3–1.2)
BUN: 15 mg/dL (ref 6–20)
CALCIUM: 10.4 mg/dL — AB (ref 8.9–10.3)
CO2: 33 mmol/L — AB (ref 22–32)
Chloride: 87 mmol/L — ABNORMAL LOW (ref 101–111)
Creatinine, Ser: 0.83 mg/dL (ref 0.61–1.24)
GFR calc Af Amer: >60 mL/min (ref >60)
GFR calc non Af Amer: >60 mL/min (ref >60)
GLUCOSE: 117 mg/dL — AB (ref 65–99)
Potassium: 3.6 mmol/L (ref 3.5–5.1)
SODIUM: 135 mmol/L (ref 135–145)
TOTAL PROTEIN: 7.4 g/dL (ref 6.5–8.1)

## 2015-09-13 LAB — URINALYSIS, ROUTINE W REFLEX MICROSCOPIC
Glucose, UA: NEGATIVE mg/dL
KETONES UR: 40 mg/dL — AB
Leukocytes, UA: NEGATIVE
NITRITE: NEGATIVE
PH: 5.5 (ref 5.0–8.0)
Protein, ur: 30 mg/dL — AB
SPECIFIC GRAVITY, URINE: 1.028 (ref 1.005–1.030)

## 2015-09-13 LAB — GLUCOSE, CAPILLARY
Glucose-Capillary: 225 mg/dL — ABNORMAL HIGH (ref 65–99)
Glucose-Capillary: 204 mg/dL — ABNORMAL HIGH (ref 65–99)

## 2015-09-13 LAB — CBC
HEMATOCRIT: 30.4 % — AB (ref 39.0–52.0)
Hemoglobin: 9.6 g/dL — ABNORMAL LOW (ref 13.0–17.0)
MCH: 25.9 pg — AB (ref 26.0–34.0)
MCHC: 31.6 g/dL (ref 30.0–36.0)
MCV: 81.9 fL (ref 78.0–100.0)
Platelets: 489 10*3/uL — ABNORMAL HIGH (ref 150–400)
RBC: 3.71 MIL/uL — ABNORMAL LOW (ref 4.22–5.81)
RDW: 16.5 % — AB (ref 11.5–15.5)
WBC: 7.2 10*3/uL (ref 4.0–10.5)

## 2015-09-13 LAB — URINE MICROSCOPIC-ADD ON: RBC / HPF: NONE SEEN RBC/hpf (ref 0–5)

## 2015-09-13 LAB — LIPASE, BLOOD: Lipase: 21 U/L (ref 11–51)

## 2015-09-13 LAB — I-STAT CG4 LACTIC ACID, ED: Lactic Acid, Venous: 1.59 mmol/L (ref 0.5–2.0)

## 2015-09-13 MED ORDER — LORAZEPAM 2 MG/ML IJ SOLN: 0.5000 mg | Freq: Four times a day (QID) | INTRAMUSCULAR | Status: DC | PRN

## 2015-09-13 MED ORDER — HYDROMORPHONE HCL 1 MG/ML IJ SOLN
1.0000 mg | INTRAMUSCULAR | Status: DC | PRN
  Administered 2015-09-13 – 2015-09-14 (×3): 1 mg via INTRAVENOUS
  Filled 2015-09-13 (×3): qty 1

## 2015-09-13 MED ORDER — OXYCODONE HCL ER 15 MG PO T12A: 15.0000 mg | EXTENDED_RELEASE_TABLET | Freq: Two times a day (BID) | ORAL | Status: DC

## 2015-09-13 MED ORDER — INSULIN ASPART 100 UNIT/ML ~~LOC~~ SOLN
0.0000 [IU] | Freq: Three times a day (TID) | SUBCUTANEOUS | Status: DC
  Administered 2015-09-14: 1 [IU] via SUBCUTANEOUS
  Administered 2015-09-14: 2 [IU] via SUBCUTANEOUS
  Administered 2015-09-14: 1 [IU] via SUBCUTANEOUS

## 2015-09-13 MED ORDER — PIPERACILLIN-TAZOBACTAM 3.375 G IVPB
3.3750 g | Freq: Once | INTRAVENOUS | Status: AC
  Administered 2015-09-13: 3.375 g via INTRAVENOUS
  Filled 2015-09-13: qty 50

## 2015-09-13 MED ORDER — ONDANSETRON HCL 4 MG PO TABS: 4.0000 mg | ORAL_TABLET | Freq: Four times a day (QID) | ORAL | Status: DC | PRN

## 2015-09-13 MED ORDER — CEFTRIAXONE SODIUM 1 G IJ SOLR
1.0000 g | INTRAMUSCULAR | Status: DC
  Administered 2015-09-13 – 2015-09-14 (×2): 1 g via INTRAVENOUS
  Filled 2015-09-13 (×2): qty 10

## 2015-09-13 MED ORDER — PROMETHAZINE HCL 25 MG/ML IJ SOLN
12.5000 mg | Freq: Four times a day (QID) | INTRAMUSCULAR | Status: DC | PRN
  Administered 2015-09-13 – 2015-09-15 (×3): 12.5 mg via INTRAVENOUS
  Filled 2015-09-13 (×6): qty 1

## 2015-09-13 MED ORDER — LORAZEPAM 2 MG/ML IJ SOLN
1.0000 mg | Freq: Once | INTRAMUSCULAR | Status: AC
  Administered 2015-09-13: 1 mg via INTRAVENOUS
  Filled 2015-09-13: qty 1

## 2015-09-13 MED ORDER — ONDANSETRON HCL 4 MG/2ML IJ SOLN: 4.0000 mg | Freq: Four times a day (QID) | INTRAMUSCULAR | Status: DC | PRN

## 2015-09-13 MED ORDER — ONDANSETRON HCL 4 MG/2ML IJ SOLN
INTRAMUSCULAR | Status: AC
  Administered 2015-09-13: 4 mg via INTRAVENOUS
  Filled 2015-09-13: qty 2

## 2015-09-13 MED ORDER — VANCOMYCIN HCL IN DEXTROSE 1-5 GM/200ML-% IV SOLN
1000.0000 mg | Freq: Once | INTRAVENOUS | Status: AC
  Administered 2015-09-13: 1000 mg via INTRAVENOUS
  Filled 2015-09-13: qty 200

## 2015-09-13 MED ORDER — ONDANSETRON HCL 4 MG/2ML IJ SOLN
4.0000 mg | Freq: Once | INTRAMUSCULAR | Status: AC | PRN
  Administered 2015-09-13: 4 mg via INTRAVENOUS
  Filled 2015-09-13: qty 2

## 2015-09-13 MED ORDER — ONDANSETRON HCL 4 MG/2ML IJ SOLN
4.0000 mg | INTRAMUSCULAR | Status: AC
  Administered 2015-09-13 – 2015-09-14 (×4): 4 mg via INTRAVENOUS
  Filled 2015-09-13 (×3): qty 2

## 2015-09-13 MED ORDER — SODIUM CHLORIDE 0.9 % IV BOLUS (SEPSIS)
2000.0000 mL | Freq: Once | INTRAVENOUS | Status: AC
  Administered 2015-09-13: 2000 mL via INTRAVENOUS

## 2015-09-13 MED ORDER — INSULIN ASPART 100 UNIT/ML ~~LOC~~ SOLN
0.0000 [IU] | Freq: Every day | SUBCUTANEOUS | Status: DC
  Administered 2015-09-13: 2 [IU] via SUBCUTANEOUS

## 2015-09-13 MED ORDER — DEXTROSE 5 % IV SOLN
500.0000 mg | INTRAVENOUS | Status: DC
  Administered 2015-09-13 – 2015-09-14 (×2): 500 mg via INTRAVENOUS
  Filled 2015-09-13 (×2): qty 500

## 2015-09-13 MED ORDER — HYDROMORPHONE HCL 1 MG/ML IJ SOLN
1.0000 mg | Freq: Once | INTRAMUSCULAR | Status: AC
Start: 2015-09-13 — End: 2015-09-13
  Administered 2015-09-13: 1 mg via INTRAVENOUS
  Filled 2015-09-13: qty 1

## 2015-09-13 MED ORDER — SODIUM CHLORIDE 0.9 % IV SOLN
INTRAVENOUS | Status: DC
  Administered 2015-09-13 – 2015-09-15 (×3): via INTRAVENOUS

## 2015-09-13 MED ORDER — ENOXAPARIN SODIUM 40 MG/0.4ML ~~LOC~~ SOLN
40.0000 mg | SUBCUTANEOUS | Status: DC
  Administered 2015-09-13: 40 mg via SUBCUTANEOUS
  Filled 2015-09-13: qty 0.4

## 2015-09-13 MED ORDER — PANTOPRAZOLE SODIUM 40 MG PO TBEC: 40.0000 mg | DELAYED_RELEASE_TABLET | Freq: Every day | ORAL | Status: DC

## 2015-09-13 NOTE — ED Provider Notes (Signed)
CSN: [id number]     Arrival date & time 10/06/2015  1218 History   First MD Initiated Contact with Patient 10/09/2015 1309     Chief Complaint  Patient presents with  . Cancer  . Emesis     (Consider location/radiation/quality/duration/timing/severity/associated sxs/prior Treatment) HPI Comments: Patient here complaining of several days of nonbilious emesis as well as nausea as well as diffuse abdominal discomfort as well as cramping. Denies any fever or chills. No black or bloody stools. Patient is currently being treated for abdominal cancer and last chemotherapy was 3 weeks ago. He does have a history of ascites and has had to have a paracentesis before the past. States that he is to have peritonitis for the past. Denies any urinary symptoms. Last bowel movement was today. Has attempted to use his home nausea and pain medication but has been unsuccessful due to emesis.  Patient is a 55 y.o. male presenting with vomiting. The history is provided by the patient and the spouse.  Emesis   Past Medical History  Diagnosis Date  . Diabetes [facility])    Past Surgical History  Procedure Laterality Date  . Spine surgery      cervical fusion  . Tonsillectomy     Family History  Problem Relation Age of Onset  . Cancer Other     Lung  . Lung cancer Mother   . Lung cancer Father   . Colon cancer Neg Hx   . Esophageal cancer Neg Hx   . Stomach cancer Neg Hx   . Cancer Paternal Grandmother 68    ovarian cancer    Social History  Substance Use Topics  . Smoking status: Former Smoker -- 1.00 packs/day for 20 years    Quit date: 04/12/2004  . Smokeless tobacco: Never Used  . Alcohol Use: No     Comment: social drinker     Review of Systems  Gastrointestinal: Positive for vomiting.  All other systems reviewed and are negative.     Allergies  Fentanyl  Home Medications   Prior to Admission medications   Medication Sig Start Date End Date Taking? Authorizing Provider  bisacodyl  (DULCOLAX) 5 MG EC tablet Take 5 mg by mouth daily as needed for moderate constipation. Reported on 08/08/2015    Historical Provider, MD  blood glucose meter kit and supplies KIT Dispense based on patient and insurance preference. Use up to four times daily as directed. E11.65 06/23/15   Shawnee Knapp, MD  dexamethasone (DECADRON) 4 MG tablet Take 1 tablet (4 mg total) by mouth 2 (two) times daily. Patient not taking: Reported on 08/15/2015 08/08/15   Truitt Merle, MD  Docusate Sodium (DULCOLAX STOOL SOFTENER PO) Take by mouth as needed. Reported on 07/31/2015    Historical Provider, MD  fentaNYL (DURAGESIC) 12 MCG/HR Place 1 patch (12.5 mcg total) onto the skin every 3 (three) days. 09/03/15   Owens Shark, NP  folic acid (FOLVITE) 1 MG tablet Take 1 tablet (1 mg total) by mouth daily. 08/08/15   Truitt Merle, MD  lactulose (CHRONULAC) 10 GM/15ML solution Take 15 mLs (10 g total) by mouth 2 (two) times daily. 08/15/15   Hayden Pedro, PA-C  magnesium citrate SOLN Take 1 Bottle by mouth daily as needed for severe constipation.    Historical Provider, MD  metFORMIN (GLUCOPHAGE) 500 MG tablet Take 1 tablet (500 mg total) by mouth 2 (two) times daily with a meal. 08/12/15   Janith Lima, MD  ondansetron [facility])  4 MG tablet Take 1 tablet (4 mg total) by mouth every 8 (eight) hours as needed for nausea or vomiting. 07/17/15   Truitt Merle, MD  oxyCODONE (OXYCONTIN) 15 mg 12 hr tablet Take 1 tablet (15 mg total) by mouth every 12 (twelve) hours. 09/05/15   Truitt Merle, MD  oxyCODONE (ROXICODONE) 5 MG/5ML solution Take 5 mLs (5 mg total) by mouth every 4 (four) hours as needed for severe pain. 09/03/15   Owens Shark, NP  pantoprazole (PROTONIX) 40 MG tablet Take 1 tablet (40 mg total) by mouth daily. 09/03/15   Owens Shark, NP  polyethylene glycol powder (MIRALAX) powder Take 1 Container by mouth as needed. Reported on 08/15/2015    Historical Provider, MD  prochlorperazine (COMPAZINE) 10 MG tablet Take 1 tablet (10 mg total)  by mouth every 6 (six) hours as needed for nausea or vomiting. 08/08/15   Truitt Merle, MD  senna (SENOKOT) 8.6 MG TABS tablet Take 1 tablet by mouth as needed for mild constipation.    Historical Provider, MD  sucralfate (CARAFATE) 1 GM/10ML suspension Take 10 mLs (1 g total) by mouth 4 (four) times daily -  with meals and at bedtime. 09/03/15   Owens Shark, NP   BP 139/98 mmHg  Pulse 111  Temp(Src) 98.2 F (36.8 C) (Oral)  Resp 18  Ht '5\' 10"'  (1.778 m)  Wt 67.132 kg  BMI 21.24 kg/m2  SpO2 98% Physical Exam  Constitutional: He is oriented to person, place, and time. He appears well-developed and well-nourished.  Non-toxic appearance. No distress.  HENT:  Head: Normocephalic and atraumatic.  Eyes: Conjunctivae, EOM and lids are normal. Pupils are equal, round, and reactive to light.  Neck: Normal range of motion. Neck supple. No tracheal deviation present. No thyroid mass present.  Cardiovascular: Normal rate, regular rhythm and normal heart sounds.  Exam reveals no gallop.   No murmur heard. Pulmonary/Chest: Effort normal and breath sounds normal. No stridor. No respiratory distress. He has no decreased breath sounds. He has no wheezes. He has no rhonchi. He has no rales.  Abdominal: Soft. Normal appearance and bowel sounds are normal. He exhibits distension and ascites. There is generalized tenderness. There is no rigidity, no rebound, no guarding and no CVA tenderness.  Musculoskeletal: Normal range of motion. He exhibits no edema or tenderness.  Neurological: He is alert and oriented to person, place, and time. He has normal strength. No cranial nerve deficit or sensory deficit. GCS eye subscore is 4. GCS verbal subscore is 5. GCS motor subscore is 6.  Skin: Skin is warm and dry. No abrasion and no rash noted.  Psychiatric: He has a normal mood and affect. His speech is normal and behavior is normal.  Nursing note and vitals reviewed.   ED Course  Procedures (including critical care  time) Labs Review Labs Reviewed  LIPASE, BLOOD  COMPREHENSIVE METABOLIC PANEL  CBC  URINALYSIS, ROUTINE W REFLEX MICROSCOPIC (NOT AT [facility])    Imaging Review No results found. I have personally reviewed and evaluated these images and lab results as part of my medical decision-making.   EKG Interpretation None      MDM   Final diagnoses:  None    Patient given IV fluids as well as pain medication here. Was started on antibiotics for suspected pneumonia. Will be admitted to the hospitalist service    Lacretia Leigh, MD 10/03/2015 [phone]

## 2015-09-13 NOTE — ED Notes (Signed)
Pt complaint of emesis/nausea onset Tuesday; unable to keep down nausea medication. Last chemo/radiation 3 weeks ago.

## 2015-09-13 NOTE — ED Notes (Signed)
Patient presents to [facility] for emesis x4 days. Is patient at cancer center with chemo card. Accompanied by wife.

## 2015-09-13 NOTE — ED Notes (Signed)
Patient aware we need urine. Patient's bag of IVF are finished. Urinal at bedside

## 2015-09-13 NOTE — H&P (Signed)
History and Physical  Patient Name: Jonathon Cline     EAV:[id number]    DOB: 07/29/1960    DOA: 09/12/2015 PCP: Scarlette Calico, MD   Patient coming from: Home  Chief Complaint: Vomiting and pain  HPI: Jonathon Cline is a 55 y.o. 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  The patient was diagnosed in March with cancer.  Found to have bone mets, possible lung mets, and peritoneal carcinomatosis at the time of diagnosis, treated with chemo to the spine and pelvis so far as well as carboplatin/Alimta.  Sees Dr. Burr Medico.     Now for several weeks he has been having progressive and intractable nausea/vomiting and epigastric discomfort bloating.  He might eat broth and a cheese sandwich, but "it just sits in my stomach until I throw it up", patient thinks he does not have pain with eating, food sticking in the throat, difficulty with swallowing or pain with swallowing, just nausea, post-prandial discomfort and then vomiting.  No relief at all with ondanstron.  No relief with PO compazine either.  Then, in the last week because of vomiting all day every day, he has had increased pain in his back and abdomen because he can't keep his meds down.  Tried a fentanyl patch, but this seemed to exacerbate his nausea.  ED course: -Afebrile, hemodynamically stable, saturating well on room air -Na 135, K 3.6, Cr 0.83, WBC 7.2, Hgb 9.6 -Lactate and lipase normal -CXR with patchy opacities     Review of Systems:  Pt complains of pain, nausea, vomiting. Pt denies any hematemesis, melena, cough, fever, sputum.  All other systems negative except as just noted or noted in the history of present illness.    Past Medical History  Diagnosis Date  . Diabetes [facility])     Past Surgical History  Procedure  Laterality Date  . Spine surgery      cervical fusion  . Tonsillectomy      Social History: Patient lives with his wife.  The patient walks unassisted before this illness.  Former smoker.    Allergies  Allergen Reactions  . Fentanyl Nausea And Vomiting    Wore patch 24 hours, then violently sick, vomiting    Family history: family history includes Cancer in his other; Cancer (age of onset: 79) in his paternal grandmother; Lung cancer in his father and mother. There is no history of Colon cancer, Esophageal cancer, or Stomach cancer.  Prior to Admission medications   Medication Sig Start Date End Date Taking? Authorizing Provider  bisacodyl (DULCOLAX) 5 MG EC tablet Take 5 mg by mouth daily as needed for moderate constipation. Reported on 08/08/2015   Yes Historical Provider, MD  dexamethasone (DECADRON) 4 MG tablet Take 1 tablet (4 mg total) by mouth 2 (two) times daily. 08/08/15  Yes Truitt Merle, MD  folic acid (FOLVITE) 1 MG tablet Take 1 tablet (1 mg total) by mouth daily. 08/08/15  Yes Truitt Merle, MD  magnesium citrate SOLN Take 1 Bottle by mouth daily as needed for severe constipation.   Yes Historical Provider, MD  metFORMIN (GLUCOPHAGE) 500 MG tablet Take 1 tablet (500 mg total) by mouth 2 (two) times daily with a meal. 08/12/15  Yes Janith Lima, MD  ondansetron (ZOFRAN) 4 MG tablet Take 1 tablet (4 mg total) by mouth  every 8 (eight) hours as needed for nausea or vomiting. 07/17/15  Yes Truitt Merle, MD  oxyCODONE (OXYCONTIN) 15 mg 12 hr tablet Take 1 tablet (15 mg total) by mouth every 12 (twelve) hours. 09/05/15  Yes Truitt Merle, MD  oxyCODONE (ROXICODONE) 5 MG/5ML solution Take 5 mLs (5 mg total) by mouth every 4 (four) hours as needed for severe pain. 09/03/15  Yes Owens Shark, NP  pantoprazole (PROTONIX) 40 MG tablet Take 1 tablet (40 mg total) by mouth daily. 09/03/15  Yes Owens Shark, NP  polyethylene glycol (MIRALAX / GLYCOLAX) packet Take 17 g by mouth daily as needed for mild  constipation.   Yes Historical Provider, MD  prochlorperazine (COMPAZINE) 10 MG tablet Take 1 tablet (10 mg total) by mouth every 6 (six) hours as needed for nausea or vomiting. 08/08/15  Yes Truitt Merle, MD  senna (SENOKOT) 8.6 MG TABS tablet Take 1 tablet by mouth as needed for mild constipation.   Yes Historical Provider, MD  sucralfate (CARAFATE) 1 GM/10ML suspension Take 10 mLs (1 g total) by mouth 4 (four) times daily -  with meals and at bedtime. 09/03/15  Yes Owens Shark, NP  blood glucose meter kit and supplies KIT Dispense based on patient and insurance preference. Use up to four times daily as directed. E11.65 06/23/15   Shawnee Knapp, MD  fentaNYL (DURAGESIC) 12 MCG/HR Place 1 patch (12.5 mcg total) onto the skin every 3 (three) days. Patient not taking: Reported on 09/29/2015 09/03/15   Owens Shark, NP  lactulose (CHRONULAC) 10 GM/15ML solution Take 15 mLs (10 g total) by mouth 2 (two) times daily. Patient not taking: Reported on 09/27/2015 08/15/15   Hayden Pedro, PA-C       Physical Exam: BP 139/98 mmHg  Pulse 111  Temp(Src) 98.2 F (36.8 C) (Oral)  Resp 18  Ht '5\' 10"'  (1.778 m)  Wt 67.132 kg (148 lb)  BMI 21.24 kg/m2  SpO2 98% General appearance: Well-developed, thin adult male, alert and in moderate distress from pain.   Eyes: Anicteric, conjunctiva pink, lids and lashes normal.     ENT: No nasal deformity, discharge, or epistaxis.  OP dry without lesions.   Lymph: No cervical or supraclavicular lymphadenopathy. Skin: Warm and dry.  Mild jaundice.  No suspicious rashes or lesions. Cardiac: Tachycardic, regular, nl S1-S2, no murmurs appreciated.  Capillary refill is brisk.  JVP normal.  Moderate pitting LE edema, symmetric.  Radial pulses 2+ and symmetric. Respiratory: Normal respiratory rate and rhythm.  CTAB without rales or wheezes. Abdomen: Abdomen firm with masses, no ascites.  Moderate diffuse TTP.    MSK: No deformities or effusions. Neuro: Oriented.  Sensorium  intact and responding to questions, attention normal.  Speech is fluent.  Moves all extremities equally and with normal coordination.    Psych: Behavior appropriate.  Affect pained.  No evidence of aural or visual hallucinations or delusions.       Labs on Admission:  I have personally reviewed following labs and imaging studies: CBC:  Recent Labs Lab 09/23/2015 1235  WBC 7.2  HGB 9.6*  HCT 30.4*  MCV 81.9  PLT 390*   Basic Metabolic Panel:  Recent Labs Lab 10/08/2015 1235  NA 135  K 3.6  CL 87*  CO2 33*  GLUCOSE 117*  BUN 15  CREATININE 0.83  CALCIUM 10.4*   GFR: Estimated Creatinine Clearance: 94.3 mL/min (by C-G formula based on Cr of 0.83). Liver Function Tests:  Recent  Labs Lab 10/02/2015 1235  AST 23  ALT 15*  ALKPHOS 129*  BILITOT 0.9  PROT 7.4  ALBUMIN 2.5*    Recent Labs Lab 10/01/2015 1235  LIPASE 21   No results for input(s): AMMONIA in the last 168 hours. Coagulation Profile: No results for input(s): INR, PROTIME in the last 168 hours. Cardiac Enzymes: No results for input(s): CKTOTAL, CKMB, CKMBINDEX, TROPONINI in the last 168 hours. BNP (last 3 results) No results for input(s): PROBNP in the last 8760 hours. HbA1C: No results for input(s): HGBA1C in the last 72 hours. CBG: No results for input(s): GLUCAP in the last 168 hours. Lipid Profile: No results for input(s): CHOL, HDL, LDLCALC, TRIG, CHOLHDL, LDLDIRECT in the last 72 hours. Thyroid Function Tests: No results for input(s): TSH, T4TOTAL, FREET4, T3FREE, THYROIDAB in the last 72 hours. Anemia Panel: No results for input(s): VITAMINB12, FOLATE, FERRITIN, TIBC, IRON, RETICCTPCT in the last 72 hours. Sepsis Labs: '@LABRCNTIP' (procalcitonin:4,lacticidven:4) )No results found for this or any previous visit (from the past 240 hour(s)).       Radiological Exams on Admission: Personally reviewed: Dg Abd Acute W/chest  09/12/2015  CLINICAL DATA:  Emesis and nausea. EXAM: DG ABDOMEN ACUTE W/  1V CHEST COMPARISON:  PET-CT 07/11/2015 FINDINGS: There is no evidence of dilated bowel loops or free intraperitoneal air. No radiopaque calculi or other significant radiographic abnormality is seen. Heart size and mediastinal contours are within normal limits. Left upper lobe lung nodule is again noted and appears unchanged from previous exam. There is a new opacity identified within the medial left lung base. Patchy density within the right midlung is identified. IMPRESSION: 1. Nonobstructive bowel gas pattern. 2. Stable left upper lobe lung nodule which may represent a focus of primary pulmonary neoplasm. 3. New right midlung and medial left lung base opacities which may reflect multifocal inflammation or infection. Electronically Signed   By: Kerby Moors M.D.   On: 09/23/2015 14:11        Assessment/Plan 1. Metastasis of unknown primary, adenoCA:  Peritoneal carcinomatosis, thoracic vertebra mets s/p rad tx.  Biopsy showed undifferentiated adenoCA, suspect lung.  CODE STATUS discussed with patient, he desires aggressive symptom management, but does not desire attempted resuscitation. -Ondansetron q4hrs scheduled for four doses, then q4hrs PRN or phenergan PRN  -Hydromorphone 1 mg IV q2hrs PRN -Lorazepam for sleep -Consult to palliative care, appreciate recommendations   2. ?pneumonia:  -Ceftriaxone and azithromycin for 5 days  3. NIDDM:  -Hold metformin -CBG daily and start SSI if elevated  4. Anemia:  Unclear type, suspect from metastasis.  Hgb dropping from 15 g/dL in March/April 2017 -Check FOBT      DVT prophylaxis: Lovenox  Code Status: DO NOT RESUSCITATE  Family Communication: Wife and sister at bedside  Disposition Plan: Anticipate fluids, IV pain and nausea medicine.  Consult to palliative care.  Family had been trying to coordinate Hospice referral before admission, this may be appropriate at discharge. Consults called: Palliative medicine Admission status:  Observation, med surg   Medical decision making: Patient seen at 4:00 PM on 09/28/2015.  The patient was discussed with Dr. Zenia Resides and Dr. Rowe Pavy from Palliative medicine. What exists of the patient's chart was reviewed in depth.  Clinical condition: stable currently for a med surg bed, not requiring additional fluids, pressors or respiratory support.        Edwin Dada [facility] Pager [phone]

## 2015-09-14 DIAGNOSIS — Z87891 Personal history of nicotine dependence: Secondary | ICD-10-CM | POA: Diagnosis not present

## 2015-09-14 DIAGNOSIS — R112 Nausea with vomiting, unspecified: Secondary | ICD-10-CM | POA: Insufficient documentation

## 2015-09-14 DIAGNOSIS — C7951 Secondary malignant neoplasm of bone: Secondary | ICD-10-CM | POA: Diagnosis present

## 2015-09-14 DIAGNOSIS — Z888 Allergy status to other drugs, medicaments and biological substances status: Secondary | ICD-10-CM | POA: Diagnosis not present

## 2015-09-14 DIAGNOSIS — E860 Dehydration: Secondary | ICD-10-CM | POA: Diagnosis present

## 2015-09-14 DIAGNOSIS — Z515 Encounter for palliative care: Secondary | ICD-10-CM | POA: Diagnosis not present

## 2015-09-14 DIAGNOSIS — R1110 Vomiting, unspecified: Secondary | ICD-10-CM | POA: Diagnosis not present

## 2015-09-14 DIAGNOSIS — J189 Pneumonia, unspecified organism: Secondary | ICD-10-CM | POA: Diagnosis not present

## 2015-09-14 DIAGNOSIS — Z7984 Long term (current) use of oral hypoglycemic drugs: Secondary | ICD-10-CM | POA: Diagnosis not present

## 2015-09-14 DIAGNOSIS — C786 Secondary malignant neoplasm of retroperitoneum and peritoneum: Secondary | ICD-10-CM | POA: Diagnosis present

## 2015-09-14 DIAGNOSIS — G893 Neoplasm related pain (acute) (chronic): Secondary | ICD-10-CM

## 2015-09-14 DIAGNOSIS — C801 Malignant (primary) neoplasm, unspecified: Secondary | ICD-10-CM

## 2015-09-14 DIAGNOSIS — R188 Other ascites: Secondary | ICD-10-CM | POA: Diagnosis present

## 2015-09-14 DIAGNOSIS — Z66 Do not resuscitate: Secondary | ICD-10-CM | POA: Diagnosis present

## 2015-09-14 DIAGNOSIS — Z801 Family history of malignant neoplasm of trachea, bronchus and lung: Secondary | ICD-10-CM | POA: Diagnosis not present

## 2015-09-14 DIAGNOSIS — C7800 Secondary malignant neoplasm of unspecified lung: Secondary | ICD-10-CM | POA: Diagnosis present

## 2015-09-14 DIAGNOSIS — D630 Anemia in neoplastic disease: Secondary | ICD-10-CM | POA: Diagnosis present

## 2015-09-14 DIAGNOSIS — Z7189 Other specified counseling: Secondary | ICD-10-CM | POA: Insufficient documentation

## 2015-09-14 DIAGNOSIS — E119 Type 2 diabetes mellitus without complications: Secondary | ICD-10-CM | POA: Diagnosis present

## 2015-09-14 DIAGNOSIS — Z7952 Long term (current) use of systemic steroids: Secondary | ICD-10-CM | POA: Diagnosis not present

## 2015-09-14 DIAGNOSIS — Z79899 Other long term (current) drug therapy: Secondary | ICD-10-CM | POA: Diagnosis not present

## 2015-09-14 DIAGNOSIS — Z79891 Long term (current) use of opiate analgesic: Secondary | ICD-10-CM | POA: Diagnosis not present

## 2015-09-14 LAB — OCCULT BLOOD GASTRIC / DUODENUM (SPECIMEN CUP)
OCCULT BLOOD, GASTRIC: POSITIVE — AB
pH, Gastric: 3

## 2015-09-14 LAB — BASIC METABOLIC PANEL
Anion gap: 13 (ref 5–15)
BUN: 16 mg/dL (ref 6–20)
CO2: 34 mmol/L — ABNORMAL HIGH (ref 22–32)
CREATININE: 0.82 mg/dL (ref 0.61–1.24)
Calcium: 9.5 mg/dL (ref 8.9–10.3)
Chloride: 91 mmol/L — ABNORMAL LOW (ref 101–111)
Glucose, Bld: 128 mg/dL — ABNORMAL HIGH (ref 65–99)
POTASSIUM: 3.4 mmol/L — AB (ref 3.5–5.1)
SODIUM: 138 mmol/L (ref 135–145)

## 2015-09-14 LAB — GLUCOSE, CAPILLARY
GLUCOSE-CAPILLARY: 133 mg/dL — AB (ref 65–99)
GLUCOSE-CAPILLARY: 197 mg/dL — AB (ref 65–99)
GLUCOSE-CAPILLARY: 177 mg/dL — AB (ref 65–99)
Glucose-Capillary: 143 mg/dL — ABNORMAL HIGH (ref 65–99)

## 2015-09-14 LAB — CBC
HEMATOCRIT: 28 % — AB (ref 39.0–52.0)
Hemoglobin: 8.8 g/dL — ABNORMAL LOW (ref 13.0–17.0)
MCH: 26 pg (ref 26.0–34.0)
MCHC: 31.4 g/dL (ref 30.0–36.0)
MCV: 82.6 fL (ref 78.0–100.0)
PLATELETS: 490 10*3/uL — AB (ref 150–400)
RBC: 3.39 MIL/uL — AB (ref 4.22–5.81)
RDW: 16.6 % — AB (ref 11.5–15.5)
WBC: 8.6 10*3/uL (ref 4.0–10.5)

## 2015-09-14 MED ORDER — ONDANSETRON HCL 4 MG/2ML IJ SOLN: 4.0000 mg | Freq: Four times a day (QID) | INTRAMUSCULAR | Status: DC | PRN

## 2015-09-14 MED ORDER — DIPHENHYDRAMINE HCL 12.5 MG/5ML PO ELIX: 12.5000 mg | ORAL_SOLUTION | Freq: Four times a day (QID) | ORAL | Status: DC | PRN

## 2015-09-14 MED ORDER — HALOPERIDOL LACTATE 5 MG/ML IJ SOLN
0.5000 mg | Freq: Four times a day (QID) | INTRAMUSCULAR | Status: DC
  Administered 2015-09-14 – 2015-09-15 (×4): 0.5 mg via INTRAVENOUS
  Filled 2015-09-14 (×4): qty 1

## 2015-09-14 MED ORDER — NALOXONE HCL 0.4 MG/ML IJ SOLN: 0.4000 mg | INTRAMUSCULAR | Status: DC | PRN

## 2015-09-14 MED ORDER — ENSURE ENLIVE PO LIQD: 237.0000 mL | Freq: Three times a day (TID) | ORAL | Status: DC

## 2015-09-14 MED ORDER — DIPHENHYDRAMINE HCL 50 MG/ML IJ SOLN: 12.5000 mg | Freq: Four times a day (QID) | INTRAMUSCULAR | Status: DC | PRN

## 2015-09-14 MED ORDER — HYDROMORPHONE 1 MG/ML IV SOLN
INTRAVENOUS | Status: DC
  Administered 2015-09-14: 2.2 mg via INTRAVENOUS
  Administered 2015-09-14: 2.33 mg via INTRAVENOUS
  Administered 2015-09-14: 10:00:00 via INTRAVENOUS
  Administered 2015-09-14: 1.61 mg via INTRAVENOUS
  Administered 2015-09-15: 1.69 mg via INTRAVENOUS
  Administered 2015-09-15: 1 mg via INTRAVENOUS
  Filled 2015-09-14: qty 25

## 2015-09-14 MED ORDER — BISACODYL 10 MG RE SUPP: 10.0000 mg | Freq: Every day | RECTAL | Status: DC | PRN

## 2015-09-14 MED ORDER — ONDANSETRON HCL 4 MG/2ML IJ SOLN
4.0000 mg | Freq: Four times a day (QID) | INTRAMUSCULAR | Status: DC | PRN
  Administered 2015-09-14: 4 mg via INTRAVENOUS
  Filled 2015-09-14: qty 2

## 2015-09-14 MED ORDER — HYDRALAZINE HCL 20 MG/ML IJ SOLN
5.0000 mg | Freq: Four times a day (QID) | INTRAMUSCULAR | Status: DC | PRN
Start: 2015-09-14 — End: 2015-09-15
  Administered 2015-09-14: 5 mg via INTRAVENOUS
  Filled 2015-09-14: qty 1

## 2015-09-14 MED ORDER — SODIUM CHLORIDE 0.9% FLUSH: 9.0000 mL | INTRAVENOUS | Status: DC | PRN

## 2015-09-14 MED ORDER — HYDROMORPHONE 1 MG/ML IV SOLN: INTRAVENOUS | Status: DC

## 2015-09-14 MED ORDER — LORAZEPAM 2 MG/ML IJ SOLN: 0.5000 mg | INTRAMUSCULAR | Status: DC | PRN

## 2015-09-14 NOTE — Progress Notes (Signed)
Patient's blood pressure was 134/103 and HR 128 this morning. Received order for Hydralazine 5 mg prn. Same was at approximately 0535. Blood pressure is  Now 118/93. Gastric specimen sent for occult studies.

## 2015-09-14 NOTE — Consult Note (Signed)
Consultation Note Date: 09/14/2015   Patient Name: Jonathon Cline  DOB: October 05, 1960  MRN: [id number]  Age / Sex: 55 y.o., male  PCP: Janith Lima, MD Referring Physician: Florencia Reasons, MD  Reason for Consultation: Hospice Evaluation, Non pain symptom management and Pain control  HPI/Patient Profile: 55 y.o. male    admitted on 09/29/2015    Clinical Assessment and Goals of Care:  Jonathon Cline is a 55 y.o. 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  The patient was diagnosed in March with cancer.  Found to have bone mets, possible lung mets, and peritoneal carcinomatosis at the time of diagnosis, biopsy showed undifferentiated adenocarcinoma, treated with chemo to the spine and pelvis so far as well as carboplatin/Alimta.  Sees Dr. Burr Medico, was recently recommended for hospice services.   Now for several weeks he has been having progressive and intractable nausea/vomiting and epigastric discomfort bloating.  He might eat broth and a cheese sandwich, but "it just sits in my stomach until I throw it up", patient thinks he does not have pain with eating, food sticking in the throat, difficulty with swallowing or pain with swallowing, just nausea, post-prandial discomfort and then vomiting.  No relief at all with ondanstron.  No relief with PO compazine either.  Then, in the last week because of vomiting all day every day, he has had increased pain in his back and abdomen because he can't keep his meds down.  Tried a fentanyl patch, but this seemed to exacerbate his nausea.  Patient has been admitted for symptom management and also for PNA.   Mr Goree is resting in bed,  he appears nauseous, wife is at the bedside. He did not rest well overnight, he has ongoing nausea, he threw up last night. Last bowel movement was 2 days ago. Xray abdomen in ED showed PNA in bases, non obstructive bowel gas pattern. He has poor appetite, he has pain all over, both pain and nausea/vomiting are not controlled, he has not able to keep anything down. He is in moderate distress. Wife asking about how hospice services will work.   I introduced palliative care as follows: Palliative medicine is specialized medical care for people living with serious illness. It focuses on providing relief from the symptoms and stress of a serious illness. The goal is to improve quality of life for both the patient and the family.  Discussed about aggressive symptom management as noted below, palliative will continue to follow along.    Lives at home in [place], [place] with wife, has a sister who lives in [place], [place].  [place]  wife Jonathon Cline.   SUMMARY OF RECOMMENDATIONS:  1. Haldol 0.5 mg IV Q 6 hours, scheduled, for 48 hours for intractable nausea vomiting. D/C Zofran. Continue Phenergan. Check EKG in  am for QT interval as patient on Haldol and other QT prolonging medications.   2. Dilaudid IV PCA for intractable pain: 0.3 mg basal rate, 0.3 mg bolus rate Q 20 minutes, upto 3 bolus dosages allowed per hour, monitor bolus needs. D/C PO Scheduled OxyContin as patient is on PCA and monitor.   3. Ensure with meals. Long discussion with patient and wife about anorexia cachexia of malignancy. Encourage sips of fluids throughout the day rather than solid meal attempts.   4. Hospice consultation on October 07, 2015: home with hospice if symptoms improved or residential hospice for aggressive symptom management on discharge.       Code Status/Advance Care Planning:  DNR    Symptom Management:    see above   Palliative Prophylaxis:   Bowel Regimen   Psycho-social/Spiritual:   Desire for further  Chaplaincy support:no  Additional Recommendations: Education on Hospice  Prognosis:   Few weeks to few months. Agree patient is hospice eligible.   Discharge Planning: To Be Determined: home with hospice or residential hospice.       Primary Diagnoses: Present on Admission:  . Metastatic adenocarcinoma of unknown origin ([place]) . Cancer associated pain . Intractable nausea and vomiting . Dehydration . CAP (community acquired pneumonia) . Anemia  I have reviewed the medical record, interviewed the patient and family, and examined the patient. The following aspects are pertinent.  Past Medical History  Diagnosis Date  . Diabetes [facility])    Social History   Social History  . Marital Status: Married    Spouse Name: N/A  . Number of Children: N/A  . Years of Education: N/A   Social History Main Topics  . Smoking status: Former Smoker -- 1.00 packs/day for 20 years    Quit date: 04/12/2004  . Smokeless tobacco: Never Used  . Alcohol Use: No     Comment: social drinker   . Drug Use: No  . Sexual Activity: Not Asked   Other Topics Concern  . 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 Cancer Other     Lung  . Lung cancer Mother   . Lung cancer Father   . Colon cancer Neg Hx   . Esophageal cancer Neg Hx   . Stomach cancer Neg Hx   . Cancer Paternal Grandmother 56    ovarian cancer    Scheduled Meds: . azithromycin  500 mg Intravenous Q24H  . cefTRIAXone (ROCEPHIN)  IV  1 g Intravenous Q24H  . enoxaparin (LOVENOX) injection  40 mg Subcutaneous Q24H  . haloperidol lactate  0.5 mg Intravenous Q6H  . HYDROmorphone   Intravenous Q4H  . insulin aspart  0-5 Units Subcutaneous QHS    . insulin aspart  0-9 Units Subcutaneous TID WC  . pantoprazole  40 mg Oral Daily   Continuous Infusions: . sodium chloride 125 mL/hr at 09/14/15 0035   PRN Meds:.diphenhydrAMINE **OR** diphenhydrAMINE, hydrALAZINE, LORazepam, naloxone **AND** sodium chloride flush, ondansetron (ZOFRAN) IV, promethazine Medications Prior to Admission:  Prior to Admission medications   Medication Sig Start Date End Date Taking? Authorizing Provider  bisacodyl (DULCOLAX) 5 MG EC tablet  Take 5 mg by mouth daily as needed for moderate constipation. Reported on 08/08/2015   Yes Historical Provider, MD  dexamethasone (DECADRON) 4 MG tablet Take 1 tablet (4 mg total) by mouth 2 (two) times daily. 08/08/15  Yes Truitt Merle, MD  folic acid (FOLVITE) 1 MG tablet Take 1 tablet (1 mg total) by mouth daily. 08/08/15  Yes Truitt Merle, MD  magnesium citrate SOLN Take 1 Bottle by mouth daily as needed for severe constipation.   Yes Historical Provider, MD  metFORMIN (GLUCOPHAGE) 500 MG tablet Take 1 tablet (500 mg total) by mouth 2 (two) times daily with a meal. 08/12/15  Yes Janith Lima, MD  ondansetron (ZOFRAN) 4 MG tablet Take 1 tablet (4 mg total) by mouth every 8 (eight) hours as needed for nausea or vomiting. 07/17/15  Yes Truitt Merle, MD  oxyCODONE (OXYCONTIN) 15 mg 12 hr tablet Take 1 tablet (15 mg total) by mouth every 12 (twelve) hours. 09/05/15  Yes Truitt Merle, MD  oxyCODONE (ROXICODONE) 5 MG/5ML solution Take 5 mLs (5 mg total) by mouth every 4 (four) hours as needed for severe pain. 09/03/15  Yes Owens Shark, NP  pantoprazole (PROTONIX) 40 MG tablet Take 1 tablet (40 mg total) by mouth daily. 09/03/15  Yes Owens Shark, NP  polyethylene glycol (MIRALAX / GLYCOLAX) packet Take 17 g by mouth daily as needed for mild constipation.   Yes Historical Provider, MD  prochlorperazine (COMPAZINE) 10 MG tablet Take 1 tablet (10 mg total) by mouth every 6 (six) hours as needed for nausea or vomiting. 08/08/15  Yes Truitt Merle, MD  senna  (SENOKOT) 8.6 MG TABS tablet Take 1 tablet by mouth as needed for mild constipation.   Yes Historical Provider, MD  sucralfate (CARAFATE) 1 GM/10ML suspension Take 10 mLs (1 g total) by mouth 4 (four) times daily -  with meals and at bedtime. 09/03/15  Yes Owens Shark, NP  blood glucose meter kit and supplies KIT Dispense based on patient and insurance preference. Use up to four times daily as directed. E11.65 06/23/15   Shawnee Knapp, MD   Allergies  Allergen Reactions  . Fentanyl Nausea And Vomiting    Wore patch 24 hours, then violently sick, vomiting   Review of Systems + for pain, nausea, vomiting.   Physical Exam Thin gentleman   moderate distress from pain and nausea/vomiting.  Skin is warm and dry S1 S2 Trace edema Clear breath sounds Non focal    Vital Signs: BP 118/93 mmHg  Pulse 128  Temp(Src) 99 F (37.2 C) (Oral)  Resp 16  Ht '5\' 10"'  (1.778 m)  Wt 71.2 kg (156 lb 15.5 oz)  BMI 22.52 kg/m2  SpO2 95% Pain Assessment: 0-10   Pain Score:  6/10 currently  SpO2: SpO2: 95 % O2 Device:SpO2: 95 % O2 Flow Rate: .O2 Flow Rate (L/min): 2 L/min  IO: Intake/output summary:  Intake/Output Summary (Last 24 hours) at 09/14/15 0900 Last data filed at 09/14/15 0645  Gross per 24 hour  Intake 2514.7 ml  Output   3500 ml  Net -985.3 ml    LBM: Last BM Date: 09/11/15 Baseline Weight: Weight: 67.132 kg (148 lb) Most recent weight: Weight: 71.2 kg (156 lb 15.5 oz)     Palliative Assessment/Data:   Flowsheet Rows        Most Recent Value   Intake Tab    Referral Department  Hospitalist   Unit at Time of Referral  Oncology Unit  Palliative Care Primary Diagnosis  Cancer   Palliative Care Type  New Palliative care   Reason for referral  Pain, Non-pain Symptom, Clarify Goals of Care, Counsel Regarding Hospice   Date first seen by Palliative Care  09/14/15   Clinical Assessment    Palliative Performance Scale Score  30%   Pain Max last 24 hours  7   Pain Min Last 24  hours  5   Dyspnea Max Last 24 Hours  5   Dyspnea Min Last 24 hours  3   Nausea Max Last 24 Hours  9   Nausea Min Last 24 Hours  7   Anxiety Max Last 24 Hours  5   Anxiety Min Last 24 Hours  3   Psychosocial & Spiritual Assessment    Palliative Care Outcomes    Patient/Family meeting held?  Yes   Who was at the meeting?  patient, wife    Palliative Care follow-up planned  Yes, Home      Time In: 8 Time Out: 9 Time Total: 60 min  Greater than 50%  of this time was spent counseling and coordinating care related to the above assessment and plan.  Signed by: Loistine Chance, MD  [phone]  Please contact Palliative Medicine Team phone at [phone] for questions and concerns.  For individual provider: See Shea Evans

## 2015-09-14 NOTE — Progress Notes (Signed)
PROGRESS NOTE  Jonathon Cline [id number] DOB: 11-25-1960 DOA: 10/01/2015 PCP: Scarlette Calico, MD  HPI/Recap of past 24 hours:  Feeling nauseous, vomited early this morning, denies hematemesis, + ab pain, last bm two days ago, kub no obstruction, Wife in room  Assessment/Plan: Principal Problem:   Intractable nausea and vomiting Active Problems:   Type 2 diabetes mellitus without complication, without long-term current use of insulin (HCC)   Cancer associated pain   Metastatic adenocarcinoma of unknown origin ([place])   Dehydration   CAP (community acquired pneumonia)   Anemia   Nausea with vomiting   Encounter for palliative care   Goals of care, counseling/discussion  1. Metastasis of unknown primary, adenoCA:  Peritoneal carcinomatosis, thoracic vertebra mets s/p rad tx. Biopsy showed undifferentiated adenoCA, suspect lung. CODE STATUS discussed with patient, he desires aggressive symptom management, but does not desire attempted resuscitation. -Ondansetron q4hrs scheduled for four doses, then q4hrs PRN or phenergan PRN  -Hydromorphone 1 mg IV q2hrs PRN -Lorazepam for sleep -Consult to palliative care, appreciate recommendations   2. ?pneumonia:  on xray: New right midlung and medial left lung base opacities which may reflect multifocal inflammation or infection -Ceftriaxone and azithromycin for 5 days  3. NIDDM:  -Hold metformin -CBG daily and start SSI if elevated  4. Anemia:  Unclear type, suspect from metastasis. Hgb dropping from 15 g/dL in March/April 2017 -no need of transfusion,       DVT prophylaxis: Lovenox  Code Status: DO NOT RESUSCITATE  Family Communication: Wife at bedside  Disposition Plan: pending palliative care input. Family had been trying to coordinate Hospice referral before admission, this may be appropriate at discharge. Consults called: Palliative  medicine   Procedures:  none  Antibiotics:  Rocephin/zithromax   Objective: BP 118/93 mmHg  Pulse 128  Temp(Src) 99 F (37.2 C) (Oral)  Resp 16  Ht 5\' 10"  (1.778 m)  Wt 71.2 kg (156 lb 15.5 oz)  BMI 22.52 kg/m2  SpO2 95%  Intake/Output Summary (Last 24 hours) at 09/14/15 1034 Last data filed at 09/14/15 0645  Gross per 24 hour  Intake 2514.7 ml  Output   3500 ml  Net -985.3 ml   Filed Weights   09/26/2015 1226 09/11/2015 1700  Weight: 67.132 kg (148 lb) 71.2 kg (156 lb 15.5 oz)    Exam:   General:  Frail, not comfortable  Cardiovascular: tachycardia  Respiratory: CTABL  Abdomen: diffuse tender, positive BS  Musculoskeletal: No Edema  Neuro: aaox3  Data Reviewed: Basic Metabolic Panel:  Recent Labs Lab 09/14/2015 1235 09/14/15 0347  NA 135 138  K 3.6 3.4*  CL 87* 91*  CO2 33* 34*  GLUCOSE 117* 128*  BUN 15 16  CREATININE 0.83 0.82  CALCIUM 10.4* 9.5   Liver Function Tests:  Recent Labs Lab 09/25/2015 1235  AST 23  ALT 15*  ALKPHOS 129*  BILITOT 0.9  PROT 7.4  ALBUMIN 2.5*    Recent Labs Lab 09/26/2015 1235  LIPASE 21   No results for input(s): AMMONIA in the last 168 hours. CBC:  Recent Labs Lab 09/24/2015 1235 09/14/15 0347  WBC 7.2 8.6  HGB 9.6* 8.8*  HCT 30.4* 28.0*  MCV 81.9 82.6  PLT 489* 490*   Cardiac Enzymes:   No results for input(s): CKTOTAL, CKMB, CKMBINDEX, TROPONINI in the last 168 hours. BNP (last 3 results) No results for input(s): BNP in the last 8760 hours.  ProBNP (last 3 results) No results for input(s): PROBNP in the last  8760 hours.  CBG:  Recent Labs Lab 09/11/2015 2140 09/22/2015 2242 09/14/15 0714  GLUCAP 225* 204* 133*    No results found for this or any previous visit (from the past 240 hour(s)).   Studies: Dg Abd Acute W/chest  09/29/2015  CLINICAL DATA:  Emesis and nausea. EXAM: DG ABDOMEN ACUTE W/ 1V CHEST COMPARISON:  PET-CT 07/11/2015 FINDINGS: There is no evidence of dilated bowel  loops or free intraperitoneal air. No radiopaque calculi or other significant radiographic abnormality is seen. Heart size and mediastinal contours are within normal limits. Left upper lobe lung nodule is again noted and appears unchanged from previous exam. There is a new opacity identified within the medial left lung base. Patchy density within the right midlung is identified. IMPRESSION: 1. Nonobstructive bowel gas pattern. 2. Stable left upper lobe lung nodule which may represent a focus of primary pulmonary neoplasm. 3. New right midlung and medial left lung base opacities which may reflect multifocal inflammation or infection. Electronically Signed   By: Kerby Moors M.D.   On: 09/16/2015 14:11    Scheduled Meds: . azithromycin  500 mg Intravenous Q24H  . cefTRIAXone (ROCEPHIN)  IV  1 g Intravenous Q24H  . enoxaparin (LOVENOX) injection  40 mg Subcutaneous Q24H  . feeding supplement (ENSURE ENLIVE)  237 mL Oral TID BM  . haloperidol lactate  0.5 mg Intravenous Q6H  . HYDROmorphone   Intravenous Q4H  . insulin aspart  0-5 Units Subcutaneous QHS  . insulin aspart  0-9 Units Subcutaneous TID WC  . pantoprazole  40 mg Oral Daily    Continuous Infusions: . sodium chloride 125 mL/hr at 09/14/15 0035     Time spent: 29mins  Latiqua Daloia MD, PhD  [facility] Pager [phone]. If 7PM-7AM, please contact night-coverage at [email or web address], password [facility] 09/14/2015, 10:34 AM

## 2015-09-15 DIAGNOSIS — Z515 Encounter for palliative care: Secondary | ICD-10-CM

## 2015-09-15 LAB — MAGNESIUM: MAGNESIUM: 1.7 mg/dL (ref 1.7–2.4)

## 2015-09-15 LAB — BASIC METABOLIC PANEL
ANION GAP: 14 (ref 5–15)
BUN: 25 mg/dL — ABNORMAL HIGH (ref 6–20)
CHLORIDE: 93 mmol/L — AB (ref 101–111)
CO2: 29 mmol/L (ref 22–32)
Calcium: 9.1 mg/dL (ref 8.9–10.3)
Creatinine, Ser: 1.13 mg/dL (ref 0.61–1.24)
GFR calc non Af Amer: >60 mL/min (ref >60)
Glucose, Bld: 154 mg/dL — ABNORMAL HIGH (ref 65–99)
POTASSIUM: 3.8 mmol/L (ref 3.5–5.1)
SODIUM: 136 mmol/L (ref 135–145)

## 2015-09-16 ENCOUNTER — Encounter: Payer: Commercial Managed Care - PPO | Admitting: Nutrition

## 2015-09-18 LAB — CULTURE, BLOOD (ROUTINE X 2)
CULTURE: NO GROWTH
Culture: NO GROWTH

## 2015-09-22 ENCOUNTER — Encounter: Payer: Self-pay | Admitting: General Practice

## 2015-09-22 NOTE — Progress Notes (Signed)
Spiritual Care Note  Reached spouse Jan by phone, offering bereavement support and encouragement to seek continued emotional care.  Provided empathic listening and normalized feelings.  She was very open to grief counseling, has heard great things about hospice's free offerings, and plans to call [facility] to initiate 1:1 counseling support. She also still has my card and plans to reach out if further spiritual support desired.  [place], [place], [place] [facility] Pager [phone] Voicemail [phone]

## 2015-09-23 ENCOUNTER — Other Ambulatory Visit: Payer: Self-pay | Admitting: Hematology

## 2015-10-06 ENCOUNTER — Other Ambulatory Visit: Payer: Commercial Managed Care - PPO

## 2015-10-07 NOTE — Progress Notes (Signed)
  Radiation Oncology         [phone] ________________________________  Name: SHRIHAN DICOLA MRN: [id number]  Date: 08/21/2015  DOB: Apr 25, 1960  End of Treatment Note  Diagnosis:   Metastatic carcinoma with multiple sites of bony metastasis     Indication for treatment::  palliative       Radiation treatment dates:   07/31/2015 through 08/21/2015  Site/dose:    1.  The patient was treated to the right hip to a dose of 37.5 gray at 2.5 gray per fraction 2.  The patient was treated to the left second rib to a dose of 37.5 gray at 2.5 gray per fraction 3.  The patient was treated to the T8-T12 vertebral bodies to a total dose of 37.5 gray at 2.5 gray per fraction.  Narrative: The patient tolerated radiation treatment relatively well.     Plan: The patient has completed radiation treatment. The patient will return to radiation oncology clinic for routine followup in one month. I advised the patient to call or return sooner if they have any questions or concerns related to their recovery or treatment. ________________________________  Jodelle Gross, M.D., Ph.D.

## 2015-10-08 ENCOUNTER — Institutional Professional Consult (permissible substitution): Payer: Self-pay | Admitting: Radiation Oncology

## 2015-10-11 NOTE — Progress Notes (Signed)
Went in patient's room at approximately [phone] and noticed he was not breathing. Spouse was asleep on bench bed. Death was verified with Herschell Dimes , RN and patient was pronounced dead at 26. Emotional support given to spouse.

## 2015-10-11 NOTE — Discharge Summary (Signed)
Discharge Summary  Jonathon Cline [id number] DOB: 1960-06-20  PCP: Scarlette Calico, MD  Admit date: Sep 16, 2015 Time of death: 5:27 am 09-18-2015  Time spent: <8mins   Discharge Diagnoses:  Active Hospital Problems   Diagnosis Date Noted  . Intractable nausea and vomiting 09/16/15  . Nausea with vomiting   . Encounter for palliative care   . Goals of care, counseling/discussion   . Dehydration 09/16/15  . CAP (community acquired pneumonia) 09-16-2015  . Anemia 09/16/15  . 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 Cancer associated pain 07/16/2015  . Type 2 diabetes mellitus without complication, without long-term current use of insulin ([place]) 07/16/2015    Resolved Hospital Problems   Diagnosis Date Noted Date Resolved  No resolved problems to display.      Filed Weights   2015-09-16 1226 2015/09/16 1700 2015/09/18 0624  Weight: 67.132 kg (148 lb) 71.2 kg (156 lb 15.5 oz) 67.132 kg (148 lb)    History of present illness:  Chief Complaint: Vomiting and pain  HPI: Jonathon Cline is a 55 y.o. 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  The patient was diagnosed in March with cancer. Found to have bone mets, possible lung mets, and peritoneal carcinomatosis at the time of diagnosis, treated with chemo to the spine and pelvis so far as well as carboplatin/Alimta. Sees Dr. Burr Medico.   Now for several weeks he has been having progressive and intractable nausea/vomiting and epigastric discomfort bloating. He might eat broth and a cheese sandwich, but "it just sits in my stomach until I throw it up", patient thinks he does not have pain with eating, food sticking in the throat, difficulty with swallowing or pain with swallowing, just nausea,  post-prandial discomfort and then vomiting. No relief at all with ondanstron. No relief with PO compazine either. Then, in the last week because of vomiting all day every day, he has had increased pain in his back and abdomen because he can't keep his meds down.  Tried a fentanyl patch, but this seemed to exacerbate his nausea.  ED course: -Afebrile, hemodynamically stable, saturating well on room air -Na 135, K 3.6, Cr 0.83, WBC 7.2, Hgb 9.6 -Lactate and lipase normal -CXR with patchy opacities   Hospital Course:  Patient was admitted for symptom control and anticipate transition to hospice care, palliative care consulted, the morning of 6/5 at 5:27 am patient was found expired with wife in room.     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 Metastasis of unknown primary, adenoCA:  Peritoneal carcinomatosis, thoracic vertebra mets s/p rad tx. Biopsy showed undifferentiated adenoCA, suspect lung. CODE STATUS discussed with patient, he desires symptom management, but does not desire attempted resuscitation. -palliative care input appreciated   2. ?pneumonia:  on xray: New right midlung and medial left lung base opacities which may reflect multifocal inflammation or infection -Ceftriaxone and azithromycin   3. NIDDM:  -Hold metformin -CBG daily and start SSI if elevated  4. Anemia:  Unclear type, suspect from metastasis. Hgb dropping from 15 g/dL in March/April 2017 -no need of transfusion,     Code Status: DO NOT RESUSCITATE  Family Communication: Wife at bedside  Consults: Palliative  medicine   Procedures:  none  Antibiotics:  Rocephin/zithromax     Allergies  Allergen Reactions  . Fentanyl Nausea And Vomiting    Wore patch 24 hours, then violently sick, vomiting      The results of significant diagnostics from this hospitalization (including imaging, microbiology, ancillary and laboratory) are listed below for reference.    Significant Diagnostic Studies: US Paracentesis  08/28/2015  INDICATION: 55 year old male with recurrent abdominal ascites secondary to metastatic disease from an unknown primary malignancy. He presents today for a therapeutic paracentesis. EXAM: ULTRASOUND GUIDED THERAPEUTIC PARACENTESIS MEDICATIONS: 1% lidocaine COMPLICATIONS: None immediate. PROCEDURE: Informed written consent was obtained from the patient after a discussion of the risks, benefits and alternatives to treatment. A timeout was performed prior to the initiation of the procedure. Initial ultrasound scanning demonstrates a moderate amount of ascites within the right mid abdominal quadrant. The right mid abdomen was prepped and draped in the usual sterile fashion. 1% lidocaine was used for local anesthesia. Following this, a 19 gauge, 7-cm, Yueh catheter was introduced. An ultrasound image was saved for documentation purposes. The paracentesis was performed. The catheter was removed and a dressing was applied. The patient tolerated the procedure well without immediate post procedural complication. FINDINGS: A total of approximately 1.7 L of clear yellow fluid was removed. IMPRESSION: Successful ultrasound-guided paracentesis yielding 1.7 liters of peritoneal fluid. Read by: Saverio Danker, PA-C Electronically Signed   By: Sandi Mariscal M.D.   On: 08/28/2015 11:21   Dg Abd Acute W/chest  09/16/2015  CLINICAL DATA:  Emesis and nausea. EXAM: DG ABDOMEN ACUTE W/ 1V CHEST COMPARISON:  PET-CT 07/11/2015 FINDINGS: There is no evidence of dilated bowel loops or free intraperitoneal air. No  radiopaque calculi or other significant radiographic abnormality is seen. Heart size and mediastinal contours are within normal limits. Left upper lobe lung nodule is again noted and appears unchanged from previous exam. There is a new opacity identified within the medial left lung base. Patchy density within the right midlung is identified. IMPRESSION: 1. Nonobstructive bowel gas pattern. 2. Stable left upper lobe lung nodule which may represent a focus of primary pulmonary neoplasm. 3. New right midlung and medial left lung base opacities which may reflect multifocal inflammation or infection. Electronically Signed   By: Kerby Moors M.D.   On: 09/17/2015 14:11    Microbiology: Recent Results (from the past 240 hour(s))  Culture, blood (Routine X 2) w Reflex to ID Panel     Status: None (Preliminary result)   Collection Time: 09/16/2015  2:50 PM  Result Value Ref Range Status   Specimen Description BLOOD RIGHT ANTECUBITAL  Final   Special Requests BOTTLES DRAWN AEROBIC AND ANAEROBIC 5 CC EACH  Final   Culture   Final    NO GROWTH 2 DAYS Performed at [facility]    Report Status PENDING  Incomplete  Culture, blood (Routine X 2) w Reflex to ID Panel     Status: None (Preliminary result)   Collection Time: 09/23/2015  2:55 PM  Result Value Ref Range Status   Specimen Description BLOOD LEFT ANTECUBITAL  Final   Special Requests BOTTLES DRAWN AEROBIC AND ANAEROBIC 5 CC EACH  Final   Culture   Final    NO GROWTH 2  DAYS Performed at [facility]    Report Status PENDING  Incomplete     Labs: Basic Metabolic Panel:  Recent Labs Lab 09/16/2015 1235 09/14/15 0347 09/21/2015 0404  NA 135 138 136  K 3.6 3.4* 3.8  CL 87* 91* 93*  CO2 33* 34* 29  GLUCOSE 117* 128* 154*  BUN 15 16 25*  CREATININE 0.83 0.82 1.13  CALCIUM 10.4* 9.5 9.1  MG  --   --  1.7   Liver Function Tests:  Recent Labs Lab 09/11/2015 1235  AST 23  ALT 15*  ALKPHOS 129*  BILITOT 0.9  PROT 7.4   ALBUMIN 2.5*    Recent Labs Lab 10/10/2015 1235  LIPASE 21   No results for input(s): AMMONIA in the last 168 hours. CBC:  Recent Labs Lab 09/26/2015 1235 09/14/15 0347  WBC 7.2 8.6  HGB 9.6* 8.8*  HCT 30.4* 28.0*  MCV 81.9 82.6  PLT 489* 490*   Cardiac Enzymes: No results for input(s): CKTOTAL, CKMB, CKMBINDEX, TROPONINI in the last 168 hours. BNP: BNP (last 3 results) No results for input(s): BNP in the last 8760 hours.  ProBNP (last 3 results) No results for input(s): PROBNP in the last 8760 hours.  CBG:  Recent Labs Lab 09/22/2015 2242 09/14/15 0714 09/14/15 1115 09/14/15 1715 09/14/15 2154  GLUCAP 204* 133* 143* 197* 177*       Signed:  Star Resler MD, PhD  [facility] 09/21/15, 7:09 PM

## 2015-10-11 DEATH — deceased

## 2017-07-23 IMAGING — CR DG ABDOMEN 2V
2 series · 2 of 2 positions shown · non-contrast
Comparison: None

CLINICAL DATA: Abdominal pain, lump in RIGHT abdomen, testicular
pain with bowel movement, question hernia, worsened diffuse
abdominal pain since heavy lifting 3 weeks ago

EXAM:
ABDOMEN - 2 VIEW

[AP (1 of 2)]
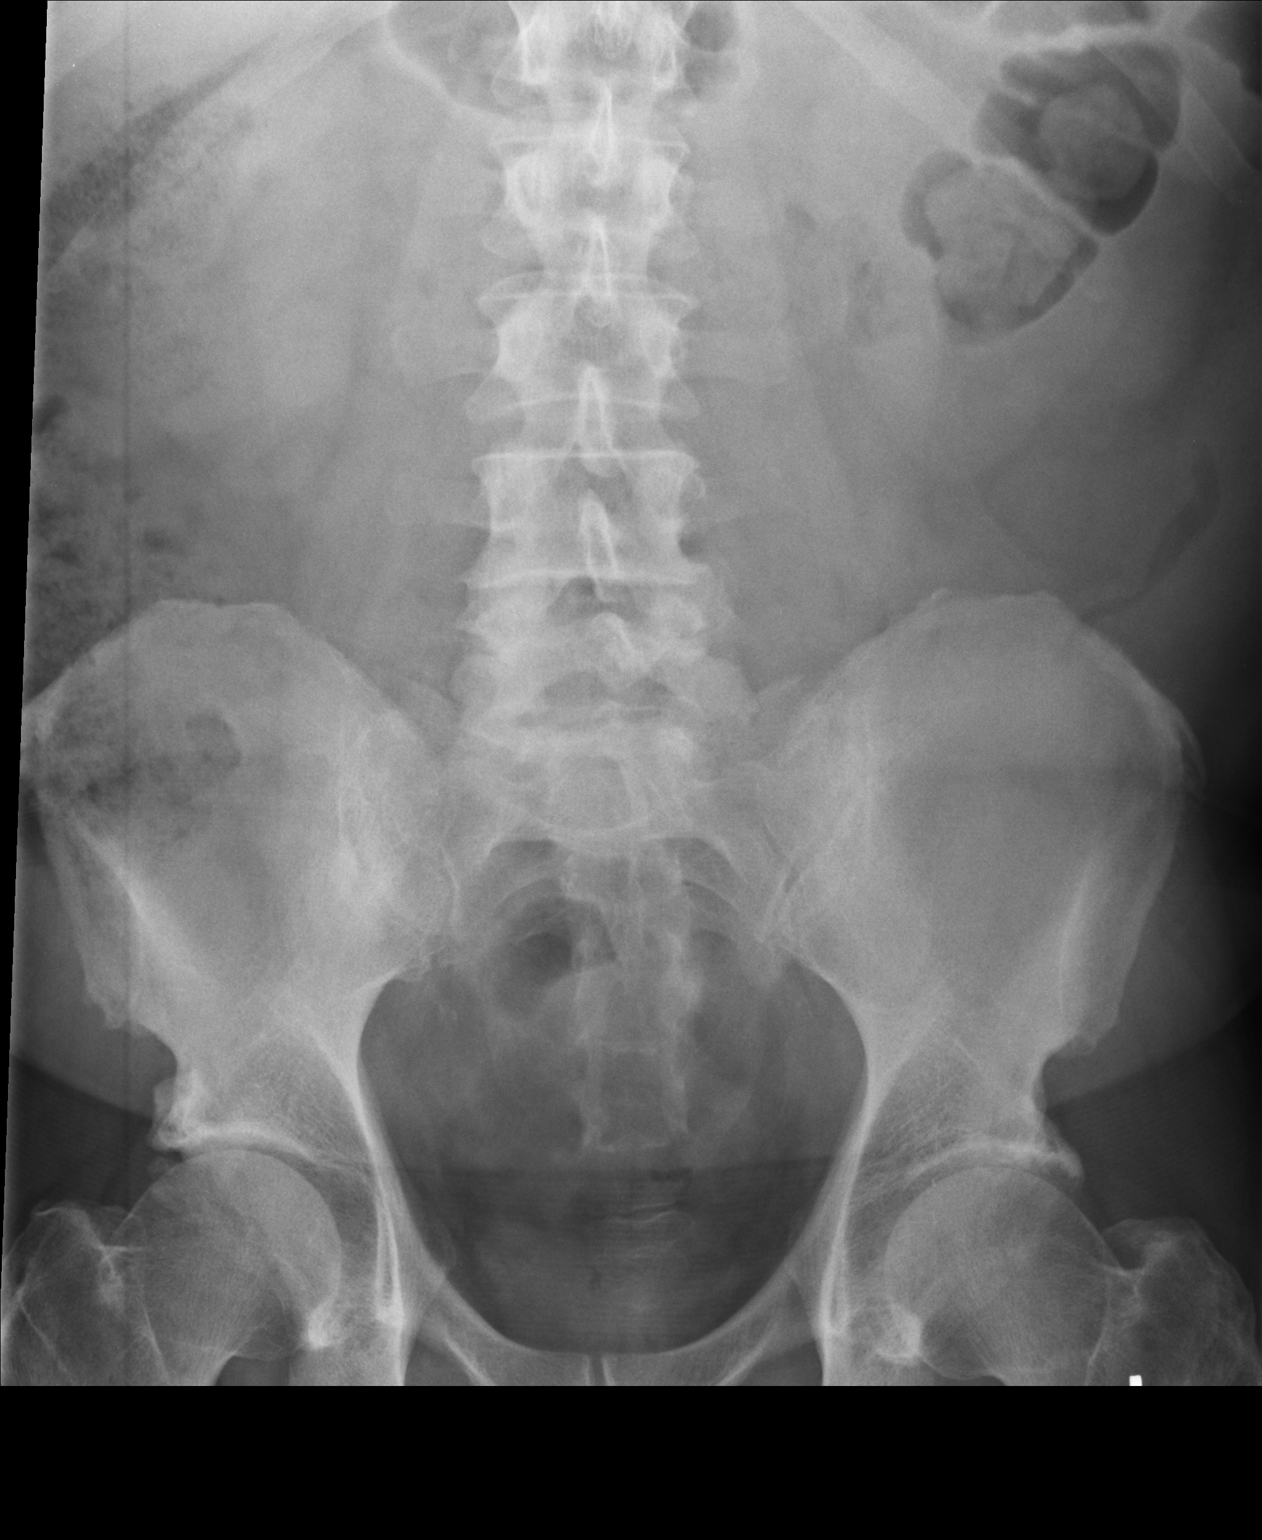

[AP (2 of 2)]
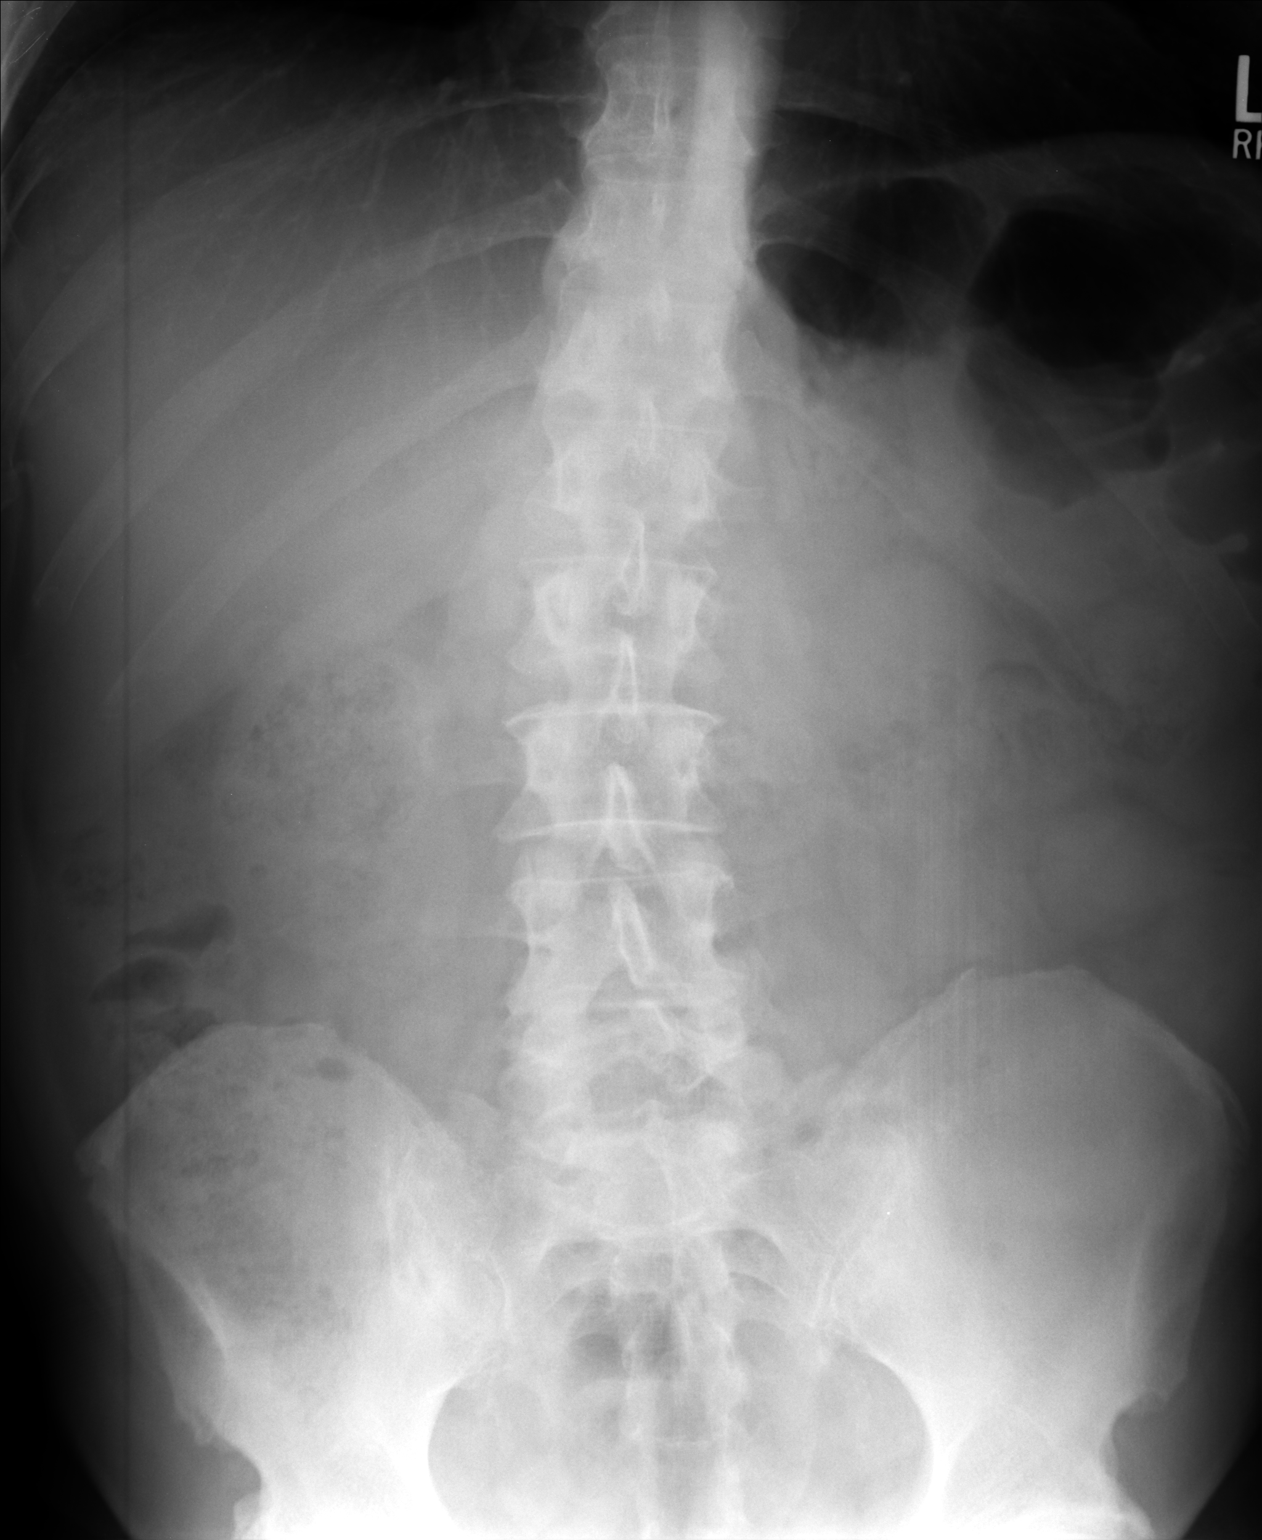

[2 of 2 positions shown; findings below may reference images not displayed]

FINDINGS: Normal bowel gas pattern.

No bowel dilatation or bowel wall thickening.

Osseous structures unremarkable.

No urinary tract calcification.

No extension of bowel gas into the RIGHT inguinal region is evident.
IMPRESSION: Normal exam.

## 2017-08-28 IMAGING — MR MR ABDOMEN WO/W CM
11 of 24 series · 20 of 48 positions shown · IV contrast (multihance)
Comparison: PET-CT on 07/11/2015 and abdomen pelvis CT on
07/04/2015

CLINICAL DATA: Metastatic adenocarcinoma of unknown primary. Lower
abdominal pain common nausea, diarrhea, and weight loss.

EXAM:
MRI ABDOMEN WITHOUT AND WITH CONTRAST
TECHNIQUE: Multiplanar multisequence MR imaging of the abdomen was performed
both before and after the administration of intravenous contrast.
CONTRAST:  17mL MULTIHANCE GADOBENATE DIMEGLUMINE 529 MG/ML IV SOLN

[Series 3: T2 fat-sat · axial · 5.0mm · 0.78mm/px · z∈[-115,+140]mm · 2 of 52 slices shown]
[im 1/52]
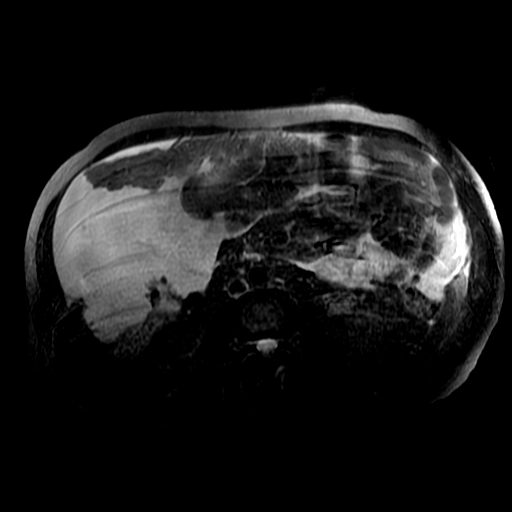
[im 52/52]
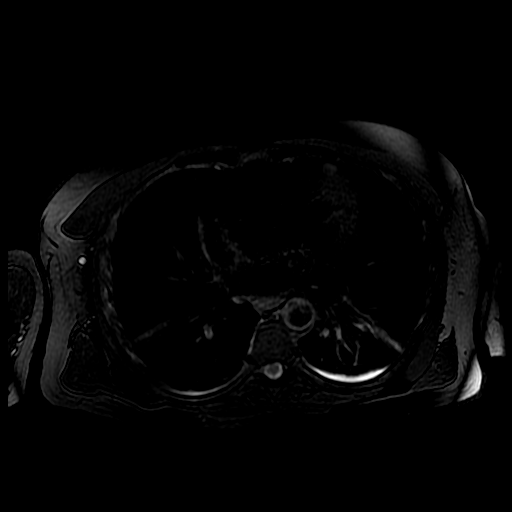

[Series 4: DWI b500 · axial · 6.0mm · 1.48mm/px · z∈[-73,+130]mm · 2 of 54 slices shown]
[im 1/54]
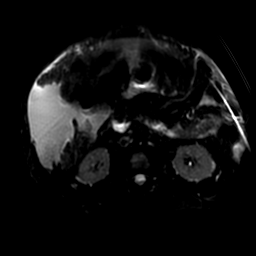
[im 54/54]
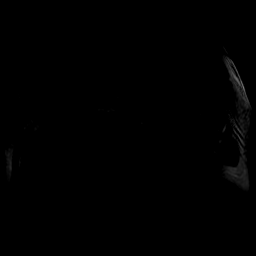

[Series 8: ax dualecho · axial · 5.0mm · 0.78mm/px · z∈[-98,+137]mm · 2 of 96 slices shown]
[im 1/96]
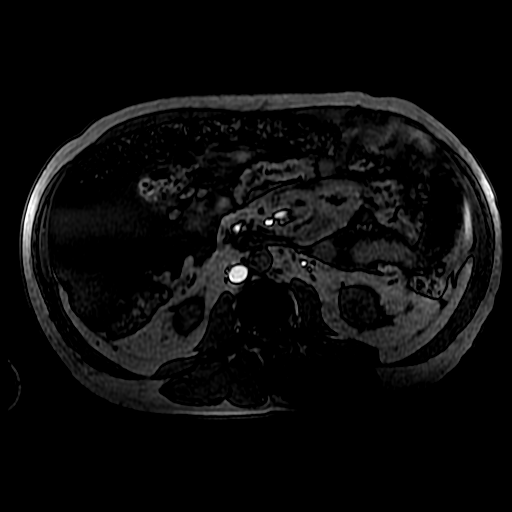
[im 96/96]
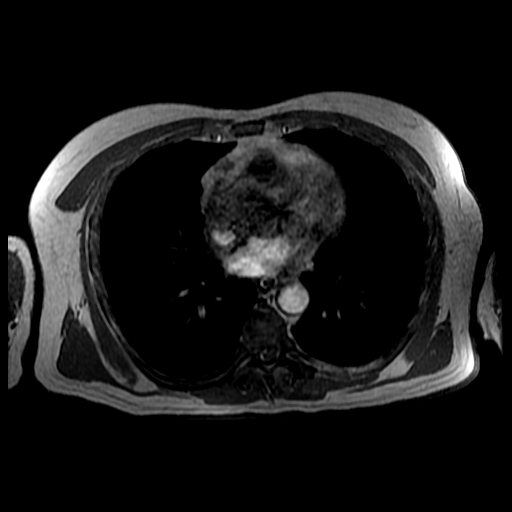

[Series 9: T2 · axial · 5.0mm · 0.78mm/px · 1 of 48 slices shown (1 of 2)]
[im 1/48]
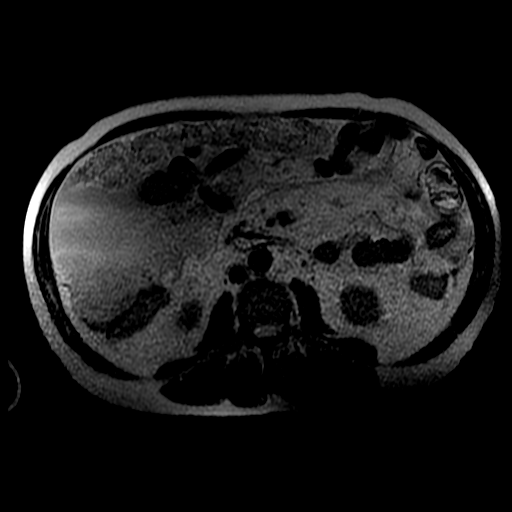

[Series 10: bSSFP · coronal · 5.0mm · 0.78mm/px · 1 of 54 slices shown]
[im 1/54]
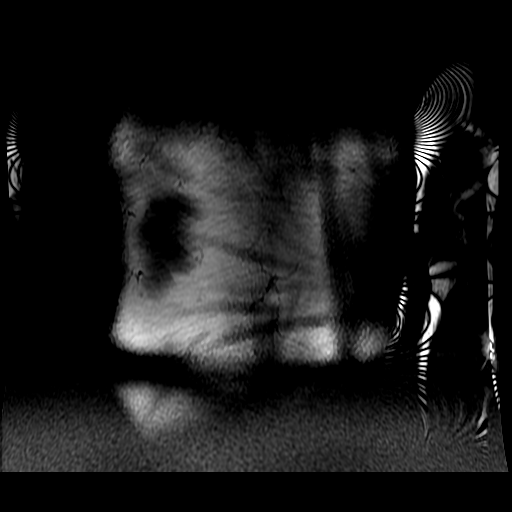

[Series 11: T2 · coronal · 5.0mm · 0.78mm/px · 1 of 53 slices shown (2 of 2)]
[im 1/53]
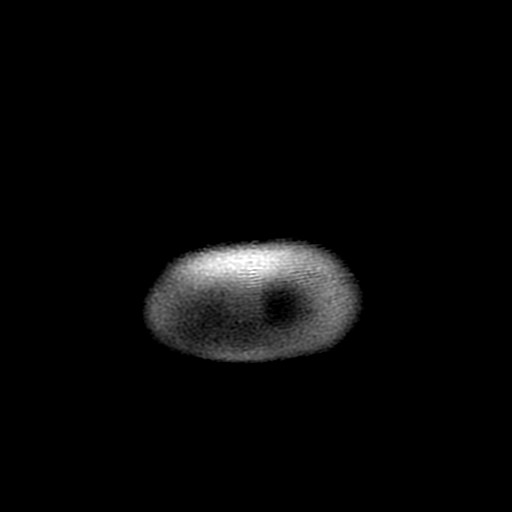

[Series 400: DWI · axial · 6.0mm · 1.48mm/px · 1 of 27 slices shown]
[im 1/27]
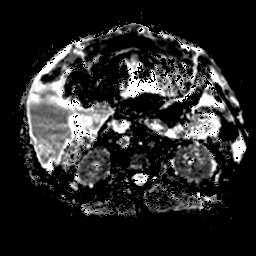

[Series 500: reformatted · axial · 1.6mm · 0.62mm/px · z∈[+41,+171]mm · 3 of 117 slices shown (1 of 2)]
[im 1/117]
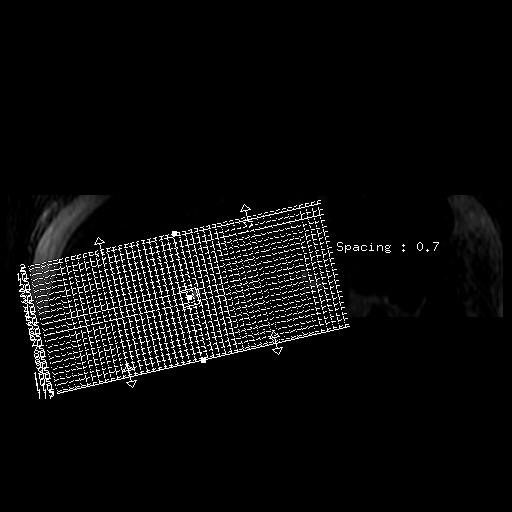
[im 59/117]
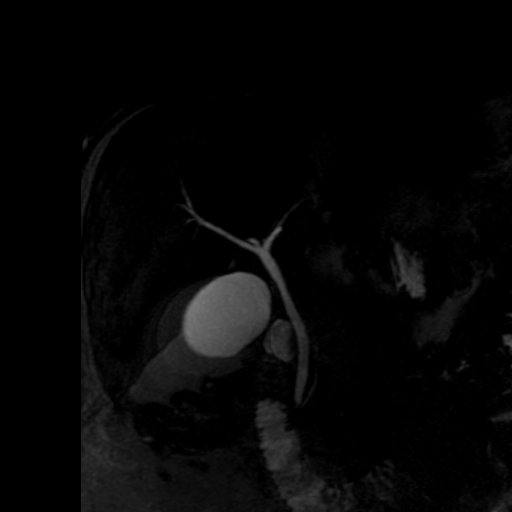
[im 117/117]
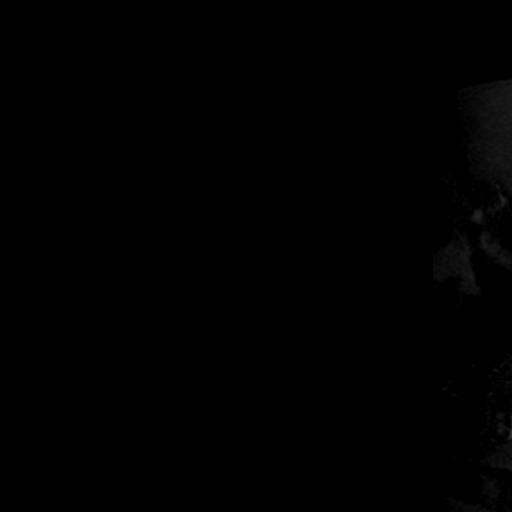

[Series 501: reformatted · axial · 1.6mm · 0.62mm/px · z∈[+41,+171]mm · 4 of 181 slices shown (2 of 2)]
[im 1/181]
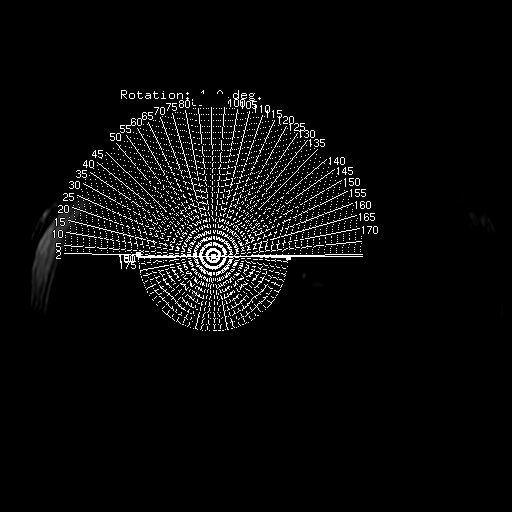
[im 61/181]
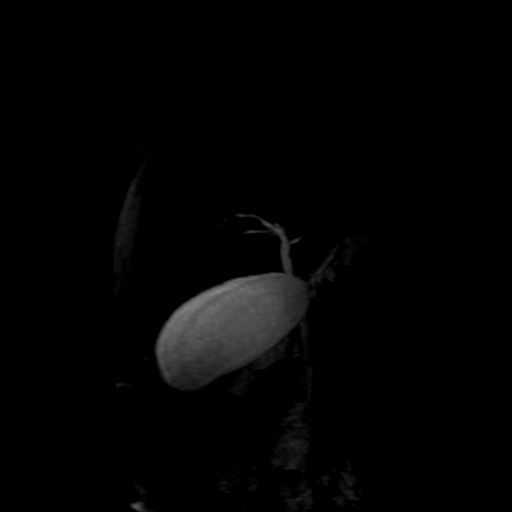
[im 121/181]
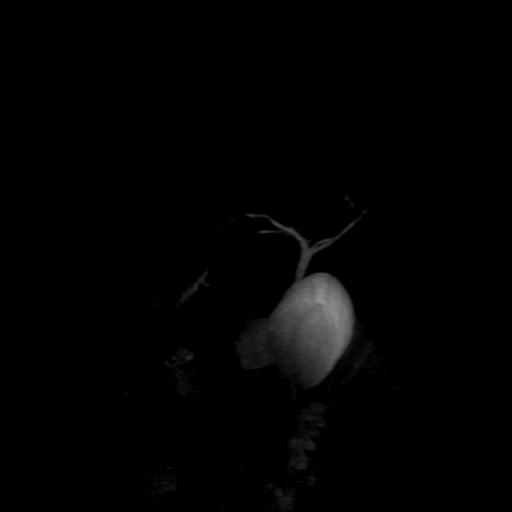
[im 181/181]
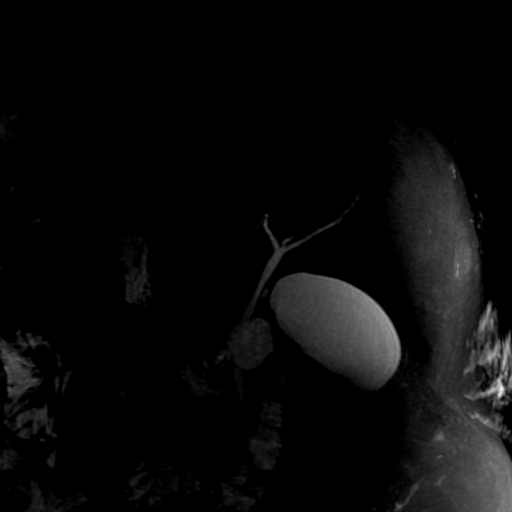

[Series 1200: T1 dynamic · axial · 5.0mm · 0.78mm/px · z∈[-118,+140]mm · 2 of 104 slices shown (1 of 2)]
[im 1/104]
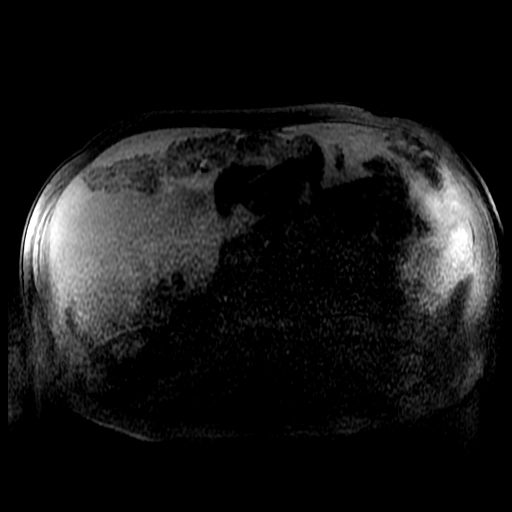
[im 104/104]
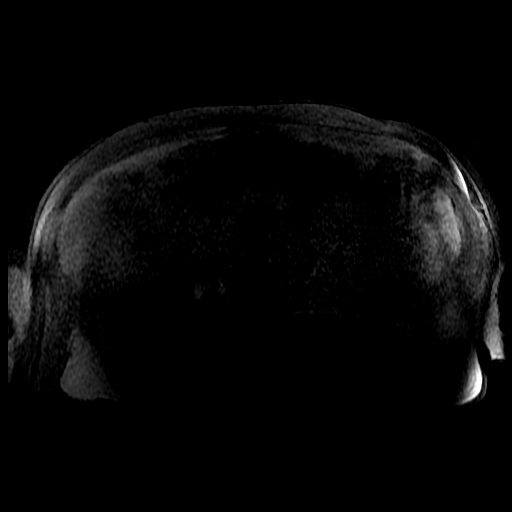

[Series 1201: T1 dynamic · axial · 5.0mm · 0.78mm/px · 1 of 104 slices shown (2 of 2)]
[im 1/104]
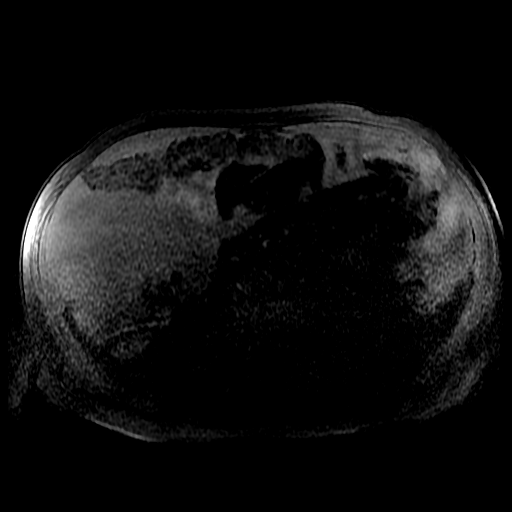

[20 of 48 positions shown; findings below may reference images not displayed]

FINDINGS: Lower chest: Tiny bilateral pleural effusions and mild dependent
bibasilar atelectasis. Small hiatal hernia again noted.

Hepatobiliary: Mild diffuse hepatic steatosis noted. No definite
liver masses are identified. Cholelithiasis is seen, without
evidence of acute cholecystitis or biliary ductal dilatation.

Pancreas: No mass, inflammatory changes, or other parenchymal
abnormality identified. No evidence pancreatic ductal dilatation.

Spleen:  Within normal limits in size and appearance.

Adrenals/Urinary Tract: No masses identified. No evidence of
hydronephrosis.

Stomach/Bowel: Visualized portions within the abdomen are
unremarkable.

Vascular/Lymphatic: No pathologically enlarged lymph nodes
identified. No abdominal aortic aneurysm demonstrated.

Other: Moderate abdominal ascites is seen as well as enhancement of
the peritoneal surfaces. Soft tissue caking is also seen throughout
the omental fat. These findings are consistent with peritoneal
carcinomatosis.

Musculoskeletal: Several T2 hyperintense bone lesions with contrast
enhancement are seen involving the lower thoracic and upper lumbar
spine, largest at level of T11, consistent with bone metastases.
IMPRESSION: Abdominal peritoneal carcinomatosis and moderate ascites, consistent
with metastatic disease.

No definite site of primary carcinoma identified within the abdomen.

Cholelithiasis, without radiographic evidence of acute cholecystitis
or biliary ductal dilatation. Hepatic steatosis also noted.

Several bone metastases involving the thoracolumbar spine.
